# Patient Record
Sex: Female | Born: 1965 | Race: Black or African American | Hispanic: No | Marital: Single | Smoking: Never smoker
Health system: Southern US, Community
[De-identification: ages and names within clinical notes are randomized; demographics above are authoritative.]

---

## 2019-05-19 ENCOUNTER — Other Ambulatory Visit: Payer: Self-pay

## 2019-05-19 ENCOUNTER — Ambulatory Visit: Payer: 59 | Admitting: Internal Medicine

## 2019-05-19 ENCOUNTER — Encounter: Payer: Self-pay | Admitting: Internal Medicine

## 2019-05-19 VITALS — BP 144/84 | HR 80 | Temp 98.2°F | Resp 16 | Ht 62.0 in | Wt 125.1 lb

## 2019-05-19 DIAGNOSIS — T783XXA Angioneurotic edema, initial encounter: Secondary | ICD-10-CM

## 2019-05-19 DIAGNOSIS — Z1211 Encounter for screening for malignant neoplasm of colon: Secondary | ICD-10-CM

## 2019-05-19 DIAGNOSIS — E559 Vitamin D deficiency, unspecified: Secondary | ICD-10-CM | POA: Diagnosis not present

## 2019-05-19 DIAGNOSIS — I10 Essential (primary) hypertension: Secondary | ICD-10-CM

## 2019-05-19 DIAGNOSIS — Z0000 Encounter for general adult medical examination without abnormal findings: Secondary | ICD-10-CM | POA: Diagnosis not present

## 2019-05-19 DIAGNOSIS — T464X5A Adverse effect of angiotensin-converting-enzyme inhibitors, initial encounter: Secondary | ICD-10-CM

## 2019-05-19 DIAGNOSIS — Z1231 Encounter for screening mammogram for malignant neoplasm of breast: Secondary | ICD-10-CM

## 2019-05-19 DIAGNOSIS — Z124 Encounter for screening for malignant neoplasm of cervix: Secondary | ICD-10-CM | POA: Insufficient documentation

## 2019-05-19 LAB — URINALYSIS, ROUTINE W REFLEX MICROSCOPIC
Bilirubin Urine: NEGATIVE
Ketones, ur: NEGATIVE
Nitrite: NEGATIVE
Specific Gravity, Urine: 1.015 (ref 1.000–1.030)
Total Protein, Urine: NEGATIVE
Urine Glucose: NEGATIVE
Urobilinogen, UA: 0.2 (ref 0.0–1.0)
pH: 6.5 (ref 5.0–8.0)

## 2019-05-19 LAB — HEPATIC FUNCTION PANEL
ALT: 10 U/L (ref 0–35)
AST: 13 U/L (ref 0–37)
Albumin: 4.1 g/dL (ref 3.5–5.2)
Alkaline Phosphatase: 67 U/L (ref 39–117)
Bilirubin, Direct: 0 mg/dL (ref 0.0–0.3)
Total Bilirubin: 0.3 mg/dL (ref 0.2–1.2)
Total Protein: 7.5 g/dL (ref 6.0–8.3)

## 2019-05-19 LAB — LIPID PANEL
Cholesterol: 161 mg/dL (ref 0–200)
HDL: 49.5 mg/dL (ref >39.00)
LDL Cholesterol: 85 mg/dL (ref 0–99)
NonHDL: 111.71
Total CHOL/HDL Ratio: 3
Triglycerides: 136 mg/dL (ref 0.0–149.0)
VLDL: 27.2 mg/dL (ref 0.0–40.0)

## 2019-05-19 LAB — BASIC METABOLIC PANEL
BUN: 10 mg/dL (ref 6–23)
CO2: 32 mEq/L (ref 19–32)
Calcium: 9.7 mg/dL (ref 8.4–10.5)
Chloride: 100 mEq/L (ref 96–112)
Creatinine, Ser: 0.84 mg/dL (ref 0.40–1.20)
GFR: 86.1 mL/min (ref >60.00)
Glucose, Bld: 90 mg/dL (ref 70–99)
Potassium: 3.9 mEq/L (ref 3.5–5.1)
Sodium: 136 mEq/L (ref 135–145)

## 2019-05-19 LAB — VITAMIN D 25 HYDROXY (VIT D DEFICIENCY, FRACTURES): VITD: 17.86 ng/mL — ABNORMAL LOW (ref 30.00–100.00)

## 2019-05-19 LAB — TSH: TSH: 1.14 u[IU]/mL (ref 0.35–4.50)

## 2019-05-19 MED ORDER — INDAPAMIDE 1.25 MG PO TABS: 1.2500 mg | ORAL_TABLET | Freq: Every day | ORAL | 0 refills | Status: DC

## 2019-05-19 NOTE — Patient Instructions (Signed)
Health Maintenance, Female Adopting a healthy lifestyle and getting preventive care are important in promoting health and wellness. Ask your health care provider about:  The right schedule for you to have regular tests and exams.  Things you can do on your own to prevent diseases and keep yourself healthy. What should I know about diet, weight, and exercise? Eat a healthy diet   Eat a diet that includes plenty of vegetables, fruits, low-fat dairy products, and lean protein.  Do not eat a lot of foods that are high in solid fats, added sugars, or sodium. Maintain a healthy weight Body mass index (BMI) is used to identify weight problems. It estimates body fat based on height and weight. Your health care provider can help determine your BMI and help you achieve or maintain a healthy weight. Get regular exercise Get regular exercise. This is one of the most important things you can do for your health. Most adults should:  Exercise for at least 150 minutes each week. The exercise should increase your heart rate and make you sweat (moderate-intensity exercise).  Do strengthening exercises at least twice a week. This is in addition to the moderate-intensity exercise.  Spend less time sitting. Even light physical activity can be beneficial. Watch cholesterol and blood lipids Have your blood tested for lipids and cholesterol at 52 years of age, then have this test every 5 years. Have your cholesterol levels checked more often if:  Your lipid or cholesterol levels are high.  You are older than 52 years of age.  You are at high risk for heart disease. What should I know about cancer screening? Depending on your health history and family history, you may need to have cancer screening at various ages. This may include screening for:  Breast cancer.  Cervical cancer.  Colorectal cancer.  Skin cancer.  Lung cancer. What should I know about heart disease, diabetes, and high blood  pressure? Blood pressure and heart disease  High blood pressure causes heart disease and increases the risk of stroke. This is more likely to develop in people who have high blood pressure readings, are of African descent, or are overweight.  Have your blood pressure checked: ? Every 3-5 years if you are 18-39 years of age. ? Every year if you are 40 years old or older. Diabetes Have regular diabetes screenings. This checks your fasting blood sugar level. Have the screening done:  Once every three years after age 40 if you are at a normal weight and have a low risk for diabetes.  More often and at a younger age if you are overweight or have a high risk for diabetes. What should I know about preventing infection? Hepatitis B If you have a higher risk for hepatitis B, you should be screened for this virus. Talk with your health care provider to find out if you are at risk for hepatitis B infection. Hepatitis C Testing is recommended for:  Everyone born from 1945 through 1965.  Anyone with known risk factors for hepatitis C. Sexually transmitted infections (STIs)  Get screened for STIs, including gonorrhea and chlamydia, if: ? You are sexually active and are younger than 52 years of age. ? You are older than 52 years of age and your health care provider tells you that you are at risk for this type of infection. ? Your sexual activity has changed since you were last screened, and you are at increased risk for chlamydia or gonorrhea. Ask your health care provider if   you are at risk.  Ask your health care provider about whether you are at high risk for HIV. Your health care provider may recommend a prescription medicine to help prevent HIV infection. If you choose to take medicine to prevent HIV, you should first get tested for HIV. You should then be tested every 3 months for as long as you are taking the medicine. Pregnancy  If you are about to stop having your period (premenopausal) and  you may become pregnant, seek counseling before you get pregnant.  Take 400 to 800 micrograms (mcg) of folic acid every day if you become pregnant.  Ask for birth control (contraception) if you want to prevent pregnancy. Osteoporosis and menopause Osteoporosis is a disease in which the bones lose minerals and strength with aging. This can result in bone fractures. If you are 65 years old or older, or if you are at risk for osteoporosis and fractures, ask your health care provider if you should:  Be screened for bone loss.  Take a calcium or vitamin D supplement to lower your risk of fractures.  Be given hormone replacement therapy (HRT) to treat symptoms of menopause. Follow these instructions at home: Lifestyle  Do not use any products that contain nicotine or tobacco, such as cigarettes, e-cigarettes, and chewing tobacco. If you need help quitting, ask your health care provider.  Do not use street drugs.  Do not share needles.  Ask your health care provider for help if you need support or information about quitting drugs. Alcohol use  Do not drink alcohol if: ? Your health care provider tells you not to drink. ? You are pregnant, may be pregnant, or are planning to become pregnant.  If you drink alcohol: ? Limit how much you use to 0-1 drink a day. ? Limit intake if you are breastfeeding.  Be aware of how much alcohol is in your drink. In the [place] one drink equals one 12 oz bottle of beer (355 mL), one 5 oz glass of wine (148 mL), or one 1 oz glass of hard liquor (44 mL). General instructions  Schedule regular health, dental, and eye exams.  Stay current with your vaccines.  Tell your health care provider if: ? You often feel depressed. ? You have ever been abused or do not feel safe at home. Summary  Adopting a healthy lifestyle and getting preventive care are important in promoting health and wellness.  Follow your health care provider's instructions about healthy  diet, exercising, and getting tested or screened for diseases.  Follow your health care provider's instructions on monitoring your cholesterol and blood pressure. This information is not intended to replace advice given to you by your health care provider. Make sure you discuss any questions you have with your health care provider. Document Revised: 02/11/2018 Document Reviewed: 02/11/2018 Elsevier Patient Education  2020 Elsevier Inc.  

## 2019-05-19 NOTE — Progress Notes (Signed)
Subjective:  Patient ID: Tracy Walton, female    DOB: 1965-05-01  Age: 54 y.o. MRN: [id number]  CC: Annual Exam and Hypertension  This visit occurred during the SARS-CoV-2 public health emergency.  Safety protocols were in place, including screening questions prior to the visit, additional usage of staff PPE, and extensive cleaning of exam room while observing appropriate contact time as indicated for disinfecting solutions.   NEW TO ME  HPI Lillias Difrancesco presents for a CPX.  She recently moved to [place] from [place].  She was being treated at [facility] for hypertension.  She has been prescribed lisinopril and hydrochlorothiazide.  Since taking that she has had intermittent welts pop up in her skin.  She has one today on her right middle finger.  She notices them but they do not bother her very much.  She denies cough, wheezing, itching, or edema.  She is very active and denies any recent episodes of CP, DOE, palpitations, edema, or fatigue.  She does not monitor her blood pressure.   History Wren has no past medical history on file.   She has no past surgical history on file.   Her family history includes Hypertension in her father and mother.She reports that she has never smoked. She has never used smokeless tobacco. She reports previous alcohol use. She reports that she does not use drugs.  Outpatient Medications Prior to Visit  Medication Sig Dispense Refill  . lisinopril-hydrochlorothiazide (ZESTORETIC) 10-12.5 MG tablet Take 1 tablet by mouth daily.     No facility-administered medications prior to visit.    ROS Review of Systems  Constitutional: Negative for diaphoresis, fatigue and unexpected weight change.  HENT: Negative.   Eyes: Negative for visual disturbance.  Respiratory: Negative for cough, chest tightness, shortness of breath and wheezing.   Cardiovascular: Negative for chest pain, palpitations and leg swelling.  Gastrointestinal: Negative  for abdominal pain, constipation, diarrhea, nausea and vomiting.  Endocrine: Negative.   Genitourinary: Negative.  Negative for difficulty urinating, dysuria and hematuria.  Musculoskeletal: Negative.  Negative for arthralgias, back pain, myalgias and neck pain.  Skin: Positive for rash. Negative for color change.  Neurological: Negative.  Negative for dizziness, weakness and light-headedness.  Hematological: Negative for adenopathy. Does not bruise/bleed easily.  Psychiatric/Behavioral: Negative.     Objective:  BP (!) 144/84 (BP Location: Left Arm, Patient Position: Sitting, Cuff Size: Normal)   Pulse 80   Temp 98.2 F (36.8 C) (Oral)   Resp 16   Ht 5\' 2"  (1.575 m)   Wt 125 lb 2 oz (56.8 kg)   SpO2 99%   BMI 22.89 kg/m   Physical Exam Vitals reviewed.  Constitutional:      Appearance: Normal appearance.  HENT:     Nose: Nose normal.     Mouth/Throat:     Mouth: Mucous membranes are moist.  Eyes:     General: No scleral icterus.    Conjunctiva/sclera: Conjunctivae normal.  Cardiovascular:     Rate and Rhythm: Normal rate and regular rhythm.     Heart sounds: No murmur.     Comments: EKG -  NSR No LVH Normal EKG Pulmonary:     Effort: Pulmonary effort is normal.     Breath sounds: No stridor. No wheezing, rhonchi or rales.  Abdominal:     General: Abdomen is flat. Bowel sounds are normal. There is no distension.     Palpations: Abdomen is soft. There is no hepatomegaly, splenomegaly or mass.  Tenderness: There is no abdominal tenderness.  Musculoskeletal:     Cervical back: Neck supple.     Right lower leg: No edema.     Left lower leg: No edema.     Comments: Middle phalanx, radial side, right middle finger there is a discrete area of soft tissue swelling that measures 2 x 3 cm.  Lymphadenopathy:     Cervical: No cervical adenopathy.  Skin:    General: Skin is warm and dry.     Findings: Rash present.  Neurological:     General: No focal deficit present.      Mental Status: She is alert.  Psychiatric:        Mood and Affect: Mood normal.        Behavior: Behavior normal.     Lab Results  Component Value Date   GLUCOSE 90 05/19/2019   CHOL 161 05/19/2019   TRIG 136.0 05/19/2019   HDL 49.50 05/19/2019   LDLCALC 85 05/19/2019   ALT 10 05/19/2019   AST 13 05/19/2019   NA 136 05/19/2019   K 3.9 05/19/2019   CL 100 05/19/2019   CREATININE 0.84 05/19/2019   BUN 10 05/19/2019   CO2 32 05/19/2019   TSH 1.14 05/19/2019    Assessment & Plan:   Klara was seen today for annual exam and hypertension.  Diagnoses and all orders for this visit:  Routine general medical examination at a health care facility- Exam completed, labs reviewed, I have requested records from her prior provider about vaccine history, she tells me she is due for screening for cervical, breast, and colon cancer. -     Lipid panel -     HIV Antibody (routine testing w rflx)  Essential hypertension- Her blood pressure is not adequately well controlled and she has angioedema.  I think this is due to the ACE inhibitor.  I have asked her to stop taking lisinopril HCTZ.  Will control her blood pressure with indapamide.  Her labs are negative for secondary causes or endorgan damage.  Her EKG is negative for LVH. -     Basic metabolic panel -     TSH -     Urinalysis, Routine w reflex microscopic -     VITAMIN D 25 Hydroxy (Vit-D Deficiency, Fractures) -     Hepatic function panel -     indapamide (LOZOL) 1.25 MG tablet; Take 1 tablet (1.25 mg total) by mouth daily. -     EKG 12-Lead  Visit for screening mammogram -     MM DIGITAL SCREENING BILATERAL; Future  Screen for colon cancer  Screening for cervical cancer -     Ambulatory referral to Gynecology  Colon cancer screening -     Cologuard  Vitamin D deficiency disease -     Cholecalciferol 50 MCG (2000 UT) TABS; Take 1 tablet (2,000 Units total) by mouth daily.  Angioedema due to angiotensin converting  enzyme inhibitor (ACE-I)   I have discontinued Talbert [place] Vita's lisinopril-hydrochlorothiazide. I am also having her start on indapamide and Cholecalciferol.  Meds ordered this encounter  Medications  . indapamide (LOZOL) 1.25 MG tablet    Sig: Take 1 tablet (1.25 mg total) by mouth daily.    Dispense:  90 tablet    Refill:  0  . Cholecalciferol 50 MCG (2000 UT) TABS    Sig: Take 1 tablet (2,000 Units total) by mouth daily.    Dispense:  90 tablet    Refill:  1  Follow-up: Return in about 3 months (around 08/19/2019).  Scarlette Calico, MD

## 2019-05-20 ENCOUNTER — Encounter: Payer: Self-pay | Admitting: Internal Medicine

## 2019-05-20 DIAGNOSIS — E559 Vitamin D deficiency, unspecified: Secondary | ICD-10-CM | POA: Insufficient documentation

## 2019-05-20 DIAGNOSIS — T464X5A Adverse effect of angiotensin-converting-enzyme inhibitors, initial encounter: Secondary | ICD-10-CM | POA: Insufficient documentation

## 2019-05-20 DIAGNOSIS — T783XXA Angioneurotic edema, initial encounter: Secondary | ICD-10-CM | POA: Insufficient documentation

## 2019-05-20 DIAGNOSIS — Z1211 Encounter for screening for malignant neoplasm of colon: Secondary | ICD-10-CM | POA: Insufficient documentation

## 2019-05-20 LAB — HIV ANTIBODY (ROUTINE TESTING W REFLEX): HIV 1&2 Ab, 4th Generation: NONREACTIVE

## 2019-05-20 MED ORDER — CHOLECALCIFEROL 50 MCG (2000 UT) PO TABS: 1.0000 | ORAL_TABLET | Freq: Every day | ORAL | 1 refills | Status: DC

## 2019-06-10 ENCOUNTER — Ambulatory Visit: Payer: 59 | Attending: Family

## 2019-06-10 DIAGNOSIS — Z23 Encounter for immunization: Secondary | ICD-10-CM

## 2019-06-10 NOTE — Progress Notes (Signed)
   Covid-19 Vaccination Clinic  Name:  Quierra Silverio    MRN: [id number] DOB: 05/29/1965  06/10/2019  Ms. Credeur was observed post Covid-19 immunization for 15 minutes without incident. She was provided with Vaccine Information Sheet and instruction to access the V-Safe system.   Ms. Papandrea was instructed to call 911 with any severe reactions post vaccine: Marland Kitchen Difficulty breathing  . Swelling of face and throat  . A fast heartbeat  . A bad rash all over body  . Dizziness and weakness   Immunizations Administered    Name Date Dose VIS Date Route   Moderna COVID-19 Vaccine 06/10/2019  3:33 PM 0.5 mL 02/02/2019 Intramuscular   Manufacturer: Moderna   Lot: [id number]   NDC: 39688-648-47

## 2019-06-11 ENCOUNTER — Encounter: Payer: Self-pay | Admitting: Internal Medicine

## 2019-07-13 ENCOUNTER — Ambulatory Visit: Payer: 59 | Attending: Family

## 2019-07-13 ENCOUNTER — Ambulatory Visit: Payer: 59

## 2019-07-13 DIAGNOSIS — Z23 Encounter for immunization: Secondary | ICD-10-CM

## 2019-07-13 NOTE — Progress Notes (Signed)
   Covid-19 Vaccination Clinic  Name:  Tracy Walton    MRN: [id number] DOB: [birth year]  07/13/2019  Ms. Biscoe was observed post Covid-19 immunization for 15 minutes without incident. She was provided with Vaccine Information Sheet and instruction to access the V-Safe system.   Ms. Haft was instructed to call 911 with any severe reactions post vaccine: Marland Kitchen Difficulty breathing  . Swelling of face and throat  . A fast heartbeat  . A bad rash all over body  . Dizziness and weakness   Immunizations Administered    Name Date Dose VIS Date Route   Moderna COVID-19 Vaccine 07/13/2019 12:22 PM 0.5 mL 02/2019 Intramuscular   Manufacturer: Moderna   Lot: [id number]   NDC: 53664-403-47

## 2019-08-12 LAB — HM DIABETES EYE EXAM

## 2019-08-19 ENCOUNTER — Ambulatory Visit: Payer: 59 | Admitting: Internal Medicine

## 2019-08-19 ENCOUNTER — Encounter: Payer: Self-pay | Admitting: Internal Medicine

## 2019-08-19 ENCOUNTER — Other Ambulatory Visit: Payer: Self-pay

## 2019-08-19 VITALS — BP 140/90 | HR 81 | Temp 98.5°F | Ht 62.0 in | Wt 127.6 lb

## 2019-08-19 DIAGNOSIS — I10 Essential (primary) hypertension: Secondary | ICD-10-CM

## 2019-08-19 DIAGNOSIS — Z1231 Encounter for screening mammogram for malignant neoplasm of breast: Secondary | ICD-10-CM

## 2019-08-19 DIAGNOSIS — E559 Vitamin D deficiency, unspecified: Secondary | ICD-10-CM | POA: Diagnosis not present

## 2019-08-19 DIAGNOSIS — M67441 Ganglion, right hand: Secondary | ICD-10-CM

## 2019-08-19 LAB — BASIC METABOLIC PANEL
BUN: 15 mg/dL (ref 6–23)
CO2: 34 mEq/L — ABNORMAL HIGH (ref 19–32)
Calcium: 9.7 mg/dL (ref 8.4–10.5)
Chloride: 97 mEq/L (ref 96–112)
Creatinine, Ser: 0.86 mg/dL (ref 0.40–1.20)
GFR: 83.71 mL/min (ref >60.00)
Glucose, Bld: 95 mg/dL (ref 70–99)
Potassium: 3.8 mEq/L (ref 3.5–5.1)
Sodium: 138 mEq/L (ref 135–145)

## 2019-08-19 LAB — VITAMIN D 25 HYDROXY (VIT D DEFICIENCY, FRACTURES): VITD: 22.01 ng/mL — ABNORMAL LOW (ref 30.00–100.00)

## 2019-08-19 MED ORDER — NEBIVOLOL HCL 5 MG PO TABS: 5.0000 mg | ORAL_TABLET | Freq: Every day | ORAL | 0 refills | Status: DC

## 2019-08-19 MED ORDER — CHOLECALCIFEROL 50 MCG (2000 UT) PO TABS: 2.0000 | ORAL_TABLET | Freq: Every day | ORAL | 1 refills | Status: DC

## 2019-08-19 NOTE — Patient Instructions (Signed)

## 2019-08-19 NOTE — Progress Notes (Signed)
Subjective:  Patient ID: Tracy Walton, female    DOB: 1965-11-07  Age: 54 y.o. MRN: [id number]  CC: Follow-up (3 month follow up)  This visit occurred during the SARS-CoV-2 public health emergency.  Safety protocols were in place, including screening questions prior to the visit, additional usage of staff PPE, and extensive cleaning of exam room while observing appropriate contact time as indicated for disinfecting solutions.    HPI Taiylor Virden presents for f/up - She tells me her blood pressure is usually around 140/90 at home.  She is very active and denies any recent episodes of chest pain, shortness of breath, palpitations, edema, fatigue, dizziness, lightheadedness, or headache.  She tells me she is taking the indapamide and the vitamin D supplement.  Outpatient Medications Prior to Visit  Medication Sig Dispense Refill  . indapamide (LOZOL) 1.25 MG tablet Take 1 tablet (1.25 mg total) by mouth daily. 90 tablet 0  . Cholecalciferol 50 MCG (2000 UT) TABS Take 1 tablet (2,000 Units total) by mouth daily. 90 tablet 1   No facility-administered medications prior to visit.    ROS Review of Systems  Constitutional: Negative for appetite change, diaphoresis, fatigue and unexpected weight change.  HENT: Negative.   Eyes: Negative for visual disturbance.  Respiratory: Negative for cough, chest tightness, shortness of breath and wheezing.   Cardiovascular: Negative for chest pain, palpitations and leg swelling.  Gastrointestinal: Negative for abdominal pain, constipation, diarrhea, nausea and vomiting.  Genitourinary: Negative.  Negative for decreased urine volume, difficulty urinating, dysuria, flank pain, hematuria and urgency.  Musculoskeletal: Negative for arthralgias, joint swelling and myalgias.  Skin: Negative.  Negative for color change and pallor.  Neurological: Negative.  Negative for dizziness, weakness, light-headedness and headaches.  Hematological: Negative for  adenopathy. Does not bruise/bleed easily.  Psychiatric/Behavioral: Negative.     Objective:  BP 140/90 (BP Location: Left Arm, Patient Position: Sitting, Cuff Size: Normal)   Pulse 81   Temp 98.5 F (36.9 C) (Oral)   Ht 5\' 2"  (1.575 m)   Wt 127 lb 9.6 oz (57.9 kg)   SpO2 98%   BMI 23.34 kg/m   BP Readings from Last 3 Encounters:  08/19/19 140/90  05/19/19 (!) 144/84    Wt Readings from Last 3 Encounters:  08/19/19 127 lb 9.6 oz (57.9 kg)  05/19/19 125 lb 2 oz (56.8 kg)    Physical Exam Vitals reviewed.  Constitutional:      Appearance: Normal appearance.  HENT:     Nose: Nose normal.     Mouth/Throat:     Mouth: Mucous membranes are moist.  Eyes:     General: No scleral icterus.    Conjunctiva/sclera: Conjunctivae normal.  Cardiovascular:     Rate and Rhythm: Normal rate and regular rhythm.     Heart sounds: No murmur heard.   Pulmonary:     Effort: Pulmonary effort is normal.     Breath sounds: No stridor. No wheezing, rhonchi or rales.  Abdominal:     General: Abdomen is flat. Bowel sounds are normal. There is no distension.     Palpations: Abdomen is soft. There is no hepatomegaly, splenomegaly or mass.     Tenderness: There is no abdominal tenderness.  Musculoskeletal:        General: Normal range of motion.     Cervical back: Neck supple.     Right lower leg: No edema.     Left lower leg: No edema.     Comments: RMF - middle  phalanx, radial side, there is a 1.5 cm well-circumscribed subcutaneous cystic lesion that is firm but freely mobile and nontender.  The overlying skin is normal.  There is no induration or fluctuance.  Lymphadenopathy:     Cervical: No cervical adenopathy.  Skin:    General: Skin is warm and dry.  Neurological:     General: No focal deficit present.     Mental Status: She is alert.     Lab Results  Component Value Date   GLUCOSE 95 08/19/2019   CHOL 161 05/19/2019   TRIG 136.0 05/19/2019   HDL 49.50 05/19/2019   LDLCALC  85 05/19/2019   ALT 10 05/19/2019   AST 13 05/19/2019   NA 138 08/19/2019   K 3.8 08/19/2019   CL 97 08/19/2019   CREATININE 0.86 08/19/2019   BUN 15 08/19/2019   CO2 34 (H) 08/19/2019   TSH 1.14 05/19/2019    Patient was never admitted.  Assessment & Plan:   Jazzman was seen today for follow-up.  Diagnoses and all orders for this visit:  Visit for screening mammogram -     MM DIGITAL SCREENING BILATERAL; Future  Essential hypertension- Her blood pressure is not adequately well controlled.  Will add nebivolol to the thiazide diuretic. -     Basic metabolic panel; Future -     nebivolol (BYSTOLIC) 5 MG tablet; Take 1 tablet (5 mg total) by mouth daily. -     Basic metabolic panel  Vitamin D deficiency disease- Her vitamin D level remains low.  I have asked her to double the dose of her vitamin D supplement. -     VITAMIN D 25 Hydroxy (Vit-D Deficiency, Fractures); Future -     VITAMIN D 25 Hydroxy (Vit-D Deficiency, Fractures) -     Cholecalciferol 50 MCG (2000 UT) TABS; Take 2 tablets (4,000 Units total) by mouth daily.  Ganglion cyst of finger of right hand- I have asked her to see hand surgery to consider having this evaluated. -     Ambulatory referral to Orthopedic Surgery   I have changed Akyra Markham's Cholecalciferol. I am also having her start on nebivolol. Additionally, I am having her maintain her indapamide.  Meds ordered this encounter  Medications  . nebivolol (BYSTOLIC) 5 MG tablet    Sig: Take 1 tablet (5 mg total) by mouth daily.    Dispense:  115 tablet    Refill:  0  . Cholecalciferol 50 MCG (2000 UT) TABS    Sig: Take 2 tablets (4,000 Units total) by mouth daily.    Dispense:  180 tablet    Refill:  1     Follow-up: Return in about 3 months (around 11/19/2019).  Sanda Linger, MD

## 2019-08-20 ENCOUNTER — Encounter: Payer: Self-pay | Admitting: Internal Medicine

## 2019-08-21 ENCOUNTER — Other Ambulatory Visit: Payer: Self-pay | Admitting: Internal Medicine

## 2019-08-21 DIAGNOSIS — I10 Essential (primary) hypertension: Secondary | ICD-10-CM

## 2019-08-30 ENCOUNTER — Encounter: Payer: Self-pay | Admitting: Internal Medicine

## 2019-08-31 ENCOUNTER — Ambulatory Visit: Payer: 59 | Admitting: Orthopaedic Surgery

## 2019-08-31 ENCOUNTER — Encounter: Payer: Self-pay | Admitting: Orthopaedic Surgery

## 2019-08-31 ENCOUNTER — Ambulatory Visit: Payer: Self-pay

## 2019-08-31 VITALS — Ht 62.0 in | Wt 127.6 lb

## 2019-08-31 DIAGNOSIS — M79641 Pain in right hand: Secondary | ICD-10-CM

## 2019-08-31 NOTE — Progress Notes (Signed)
Office Visit Note   Patient: Tracy Walton           Date of Birth: 1965/08/22           MRN: [id number] Visit Date: 08/31/2019              Requested by: Etta Grandchild, MD [address] [place],  [place] [address] PCP: Etta Grandchild, MD   Assessment & Plan: Visit Diagnoses:  1. Pain of right hand     Plan: Impression is a right long finger mass.  We will need to obtain an MRI with contrast to fully evaluate.  Follow-up after the MRI.  Follow-Up Instructions: Return if symptoms worsen or fail to improve.   Orders:  Orders Placed This Encounter  Procedures  . XR Hand Complete Right  . MR FINGERS RIGHT W CONTRAST   No orders of the defined types were placed in this encounter.     Procedures: No procedures performed   Clinical Data: No additional findings.   Subjective: Chief Complaint  Patient presents with  . Right Hand - Pain, New Patient (Initial Visit)    Middle finger lump/cyst    Tracy Walton is a 54 year old female comes in for evaluation of a mass on her right long finger that she noticed about 4 months ago.  Denies any pain.  She has not noticed any changes in size.  Denies any constitutional symptoms.  Denies a history of cancer.   Review of Systems  Constitutional: Negative.   HENT: Negative.   Eyes: Negative.   Respiratory: Negative.   Cardiovascular: Negative.   Endocrine: Negative.   Musculoskeletal: Negative.   Neurological: Negative.   Hematological: Negative.   Psychiatric/Behavioral: Negative.   All other systems reviewed and are negative.    Objective: Vital Signs: Ht 5\' 2"  (1.575 m)   Wt 127 lb 9.6 oz (57.9 kg)   BMI 23.34 kg/m   Physical Exam Vitals and nursing note reviewed.  Constitutional:      Appearance: She is well-developed.  HENT:     Head: Normocephalic and atraumatic.  Pulmonary:     Effort: Pulmonary effort is normal.  Abdominal:     Palpations: Abdomen is soft.  Musculoskeletal:     Cervical back: Neck  supple.  Skin:    General: Skin is warm.     Capillary Refill: Capillary refill takes less than 2 seconds.  Neurological:     Mental Status: She is alert and oriented to person, place, and time.  Psychiatric:        Behavior: Behavior normal.        Thought Content: Thought content normal.        Judgment: Judgment normal.     Ortho Exam Right long finger shows a palpable not mobile relatively firm mass on the ulnar aspect of the middle phalanx.  No neurovascular compromise.  Full range of motion function of the hand. Specialty Comments:  No specialty comments available.  Imaging: XR Hand Complete Right  Result Date: 08/31/2019 Soft tissue shadow of the long finger.  No bony abnormalities.    PMFS History: Patient Active Problem List   Diagnosis Date Noted  . Ganglion cyst of finger of right hand 08/19/2019  . Colon cancer screening 05/20/2019  . Vitamin D deficiency disease 05/20/2019  . Angioedema due to angiotensin converting enzyme inhibitor (ACE-I) 05/20/2019  . Routine general medical examination at a health care facility 05/19/2019  . Essential hypertension 05/19/2019  . Visit for  screening mammogram 05/19/2019  . Screen for colon cancer 05/19/2019  . Screening for cervical cancer 05/19/2019   No past medical history on file.  Family History  Problem Relation Age of Onset  . Hypertension Mother   . Hypertension Father     No past surgical history on file. Social History   Occupational History  . Not on file  Tobacco Use  . Smoking status: Never Smoker  . Smokeless tobacco: Never Used  Substance and Sexual Activity  . Alcohol use: Not Currently  . Drug use: Never  . Sexual activity: Not Currently    Partners: Female

## 2019-10-04 ENCOUNTER — Other Ambulatory Visit: Payer: Self-pay

## 2019-10-04 DIAGNOSIS — M79641 Pain in right hand: Secondary | ICD-10-CM

## 2019-11-15 ENCOUNTER — Other Ambulatory Visit: Payer: Self-pay | Admitting: Internal Medicine

## 2019-11-15 DIAGNOSIS — I10 Essential (primary) hypertension: Secondary | ICD-10-CM

## 2019-11-18 ENCOUNTER — Other Ambulatory Visit: Payer: Self-pay

## 2019-11-18 ENCOUNTER — Ambulatory Visit
Admission: RE | Admit: 2019-11-18 | Discharge: 2019-11-18 | Disposition: A | Discharge status: Home / Self Care | Payer: 59 | Source: Ambulatory Visit | Attending: Internal Medicine | Admitting: Internal Medicine

## 2019-11-18 DIAGNOSIS — Z1231 Encounter for screening mammogram for malignant neoplasm of breast: Secondary | ICD-10-CM

## 2019-12-08 ENCOUNTER — Encounter: Payer: Self-pay | Admitting: Internal Medicine

## 2019-12-08 ENCOUNTER — Other Ambulatory Visit: Payer: Self-pay

## 2019-12-08 ENCOUNTER — Ambulatory Visit: Payer: 59 | Admitting: Internal Medicine

## 2019-12-08 VITALS — BP 142/78 | HR 96 | Temp 98.4°F | Resp 16 | Ht 62.0 in | Wt 129.0 lb

## 2019-12-08 DIAGNOSIS — I10 Essential (primary) hypertension: Secondary | ICD-10-CM

## 2019-12-08 DIAGNOSIS — E876 Hypokalemia: Secondary | ICD-10-CM

## 2019-12-08 DIAGNOSIS — T502X5A Adverse effect of carbonic-anhydrase inhibitors, benzothiadiazides and other diuretics, initial encounter: Secondary | ICD-10-CM | POA: Diagnosis not present

## 2019-12-08 DIAGNOSIS — Z1211 Encounter for screening for malignant neoplasm of colon: Secondary | ICD-10-CM

## 2019-12-08 DIAGNOSIS — E559 Vitamin D deficiency, unspecified: Secondary | ICD-10-CM

## 2019-12-08 LAB — BASIC METABOLIC PANEL
BUN: 15 mg/dL (ref 6–23)
CO2: 35 mEq/L — ABNORMAL HIGH (ref 19–32)
Calcium: 10.2 mg/dL (ref 8.4–10.5)
Chloride: 97 mEq/L (ref 96–112)
Creatinine, Ser: 0.94 mg/dL (ref 0.40–1.20)
GFR: 69.41 mL/min (ref >60.00)
Glucose, Bld: 94 mg/dL (ref 70–99)
Potassium: 3.4 mEq/L — ABNORMAL LOW (ref 3.5–5.1)
Sodium: 136 mEq/L (ref 135–145)

## 2019-12-08 LAB — HM MAMMOGRAPHY

## 2019-12-08 LAB — VITAMIN D 25 HYDROXY (VIT D DEFICIENCY, FRACTURES): VITD: 26 ng/mL — ABNORMAL LOW (ref 30.00–100.00)

## 2019-12-08 MED ORDER — POTASSIUM CHLORIDE ER 10 MEQ PO TBCR: 10.0000 meq | EXTENDED_RELEASE_TABLET | Freq: Two times a day (BID) | ORAL | 0 refills | Status: DC

## 2019-12-08 MED ORDER — NEBIVOLOL HCL 5 MG PO TABS: 5.0000 mg | ORAL_TABLET | Freq: Every day | ORAL | 1 refills | Status: DC

## 2019-12-08 NOTE — Patient Instructions (Signed)

## 2019-12-08 NOTE — Progress Notes (Signed)
Subjective:  Patient ID: Tracy Walton, female    DOB: [birth year]  Age: 54 y.o. MRN: [id number]  CC: Hypertension  This visit occurred during the SARS-CoV-2 public health emergency.  Safety protocols were in place, including screening questions prior to the visit, additional usage of staff PPE, and extensive cleaning of exam room while observing appropriate contact time as indicated for disinfecting solutions.    HPI Jezabelle Chisolm presents for f/up - She does not monitor her blood pressure.  She is active and denies any recent episodes of chest pain, shortness of breath, palpitations, edema, or fatigue.  She tells me she is taking indapamide but she is not taking nebivolol.  She tells me she read some negative comments about nebivolol so she did not start taking it.  Outpatient Medications Prior to Visit  Medication Sig Dispense Refill  . Cholecalciferol 50 MCG (2000 UT) TABS Take 2 tablets (4,000 Units total) by mouth daily. 180 tablet 1  . indapamide (LOZOL) 1.25 MG tablet TAKE 1 TABLET(1.25 MG) BY MOUTH DAILY 90 tablet 0  . nebivolol (BYSTOLIC) 5 MG tablet Take 1 tablet (5 mg total) by mouth daily. 115 tablet 0   No facility-administered medications prior to visit.    ROS Review of Systems  Constitutional: Negative.  Negative for diaphoresis and fatigue.  HENT: Negative.   Eyes: Negative for visual disturbance.  Respiratory: Negative for chest tightness and shortness of breath.   Cardiovascular: Negative for chest pain, palpitations and leg swelling.  Gastrointestinal: Negative for abdominal pain, constipation, diarrhea, nausea and vomiting.  Endocrine: Negative.   Genitourinary: Negative.  Negative for difficulty urinating, dysuria and hematuria.  Musculoskeletal: Negative.  Negative for myalgias.  Skin: Negative.  Negative for color change.  Neurological: Negative for dizziness, weakness, light-headedness and headaches.  Hematological: Negative for adenopathy. Does not  bruise/bleed easily.  Psychiatric/Behavioral: Negative.     Objective:  BP (!) 142/78   Pulse 96   Temp 98.4 F (36.9 C) (Oral)   Resp 16   Ht 5\' 2"  (1.575 m)   Wt 129 lb (58.5 kg)   SpO2 99%   BMI 23.59 kg/m   BP Readings from Last 3 Encounters:  12/08/19 (!) 142/78  08/19/19 140/90  05/19/19 (!) 144/84    Wt Readings from Last 3 Encounters:  12/08/19 129 lb (58.5 kg)  08/31/19 127 lb 9.6 oz (57.9 kg)  08/19/19 127 lb 9.6 oz (57.9 kg)    Physical Exam Vitals reviewed.  Constitutional:      Appearance: Normal appearance.  HENT:     Mouth/Throat:     Mouth: Mucous membranes are moist.  Eyes:     General: No scleral icterus.    Conjunctiva/sclera: Conjunctivae normal.  Cardiovascular:     Rate and Rhythm: Normal rate and regular rhythm.  Pulmonary:     Breath sounds: No stridor. No wheezing, rhonchi or rales.  Abdominal:     General: Abdomen is flat. Bowel sounds are normal. There is no distension.     Palpations: Abdomen is soft. There is no hepatomegaly, splenomegaly or mass.  Musculoskeletal:        General: Normal range of motion.     Cervical back: Neck supple.     Right lower leg: No edema.     Left lower leg: No edema.  Lymphadenopathy:     Cervical: No cervical adenopathy.  Skin:    General: Skin is warm and dry.  Neurological:     Mental Status: She is alert.  Psychiatric:        Mood and Affect: Mood normal.        Behavior: Behavior normal.     Lab Results  Component Value Date   GLUCOSE 94 12/08/2019   CHOL 161 05/19/2019   TRIG 136.0 05/19/2019   HDL 49.50 05/19/2019   LDLCALC 85 05/19/2019   ALT 10 05/19/2019   AST 13 05/19/2019   NA 136 12/08/2019   K 3.4 (L) 12/08/2019   CL 97 12/08/2019   CREATININE 0.94 12/08/2019   BUN 15 12/08/2019   CO2 35 (H) 12/08/2019   TSH 1.14 05/19/2019    MM DIGITAL SCREENING BILATERAL  Result Date: 12/08/2019 CLINICAL DATA:  Screening. EXAM: DIGITAL SCREENING BILATERAL MAMMOGRAM WITH CAD  COMPARISON:  Previous exam(s). ACR Breast Density Category c: The breast tissue is heterogeneously dense, which may obscure small masses. FINDINGS: There are no findings suspicious for malignancy. Images were processed with CAD. IMPRESSION: No mammographic evidence of malignancy. A result letter of this screening mammogram will be mailed directly to the patient. RECOMMENDATION: Screening mammogram in one year. (Code:SM-B-01Y) BI-RADS CATEGORY  1: Negative. Electronically Signed   By: Amie Portland M.D.   On: 12/08/2019 09:31    Assessment & Plan:   Marena was seen today for hypertension.  Diagnoses and all orders for this visit:  Essential hypertension- She has not achieved her systolic blood pressure goal of 130.  I recommended that she take the nebivolol as directed.  She has developed mild hypokalemia from the thiazide diuretics I recommended that she start taking a potassium supplement.  She agrees to stay on the current dose of indapamide. -     BASIC METABOLIC PANEL WITH GFR; Future -     Basic metabolic panel; Future -     Cancel: BASIC METABOLIC PANEL WITH GFR -     Basic metabolic panel -     potassium chloride (KLOR-CON) 10 MEQ tablet; Take 1 tablet (10 mEq total) by mouth 2 (two) times daily. -     nebivolol (BYSTOLIC) 5 MG tablet; Take 1 tablet (5 mg total) by mouth daily.  Screen for colon cancer -     Cologuard  Vitamin D deficiency disease- Her vitamin D level has improved some. -     VITAMIN D 25 Hydroxy (Vit-D Deficiency, Fractures); Future -     VITAMIN D 25 Hydroxy (Vit-D Deficiency, Fractures)  Diuretic-induced hypokalemia -     potassium chloride (KLOR-CON) 10 MEQ tablet; Take 1 tablet (10 mEq total) by mouth 2 (two) times daily.   I am having Tracy Walton start on potassium chloride. I am also having her maintain her Cholecalciferol, indapamide, and nebivolol.  Meds ordered this encounter  Medications  . potassium chloride (KLOR-CON) 10 MEQ tablet    Sig: Take  1 tablet (10 mEq total) by mouth 2 (two) times daily.    Dispense:  180 tablet    Refill:  0  . nebivolol (BYSTOLIC) 5 MG tablet    Sig: Take 1 tablet (5 mg total) by mouth daily.    Dispense:  90 tablet    Refill:  1     Follow-up: Return in about 6 months (around 06/07/2020).  Sanda Linger, MD

## 2020-01-07 ENCOUNTER — Telehealth: Payer: Self-pay

## 2020-01-07 NOTE — Telephone Encounter (Signed)
LVM reminder pt to send cologuard kit & to call office if any questions.

## 2020-02-18 ENCOUNTER — Other Ambulatory Visit: Payer: Self-pay | Admitting: Internal Medicine

## 2020-02-18 DIAGNOSIS — I10 Essential (primary) hypertension: Secondary | ICD-10-CM

## 2020-02-21 ENCOUNTER — Ambulatory Visit: Payer: 59 | Attending: Family

## 2020-02-21 DIAGNOSIS — Z23 Encounter for immunization: Secondary | ICD-10-CM

## 2020-04-13 NOTE — Telephone Encounter (Addendum)
Pt states she does have the kit & will try to complete it.  Educated on the importance of colon cancer screening.  Pt verb understanding. 

## 2020-05-25 ENCOUNTER — Other Ambulatory Visit: Payer: Self-pay | Admitting: Internal Medicine

## 2020-05-25 DIAGNOSIS — I10 Essential (primary) hypertension: Secondary | ICD-10-CM

## 2020-06-07 ENCOUNTER — Encounter: Payer: Self-pay | Admitting: Internal Medicine

## 2020-06-07 ENCOUNTER — Other Ambulatory Visit: Payer: Self-pay

## 2020-06-07 ENCOUNTER — Ambulatory Visit: Payer: 59 | Admitting: Internal Medicine

## 2020-06-07 VITALS — BP 132/78 | HR 80 | Temp 98.6°F | Resp 16 | Ht 62.0 in | Wt 136.0 lb

## 2020-06-07 DIAGNOSIS — I10 Essential (primary) hypertension: Secondary | ICD-10-CM

## 2020-06-07 DIAGNOSIS — E876 Hypokalemia: Secondary | ICD-10-CM

## 2020-06-07 DIAGNOSIS — Z0000 Encounter for general adult medical examination without abnormal findings: Secondary | ICD-10-CM | POA: Diagnosis not present

## 2020-06-07 DIAGNOSIS — T502X5A Adverse effect of carbonic-anhydrase inhibitors, benzothiadiazides and other diuretics, initial encounter: Secondary | ICD-10-CM | POA: Diagnosis not present

## 2020-06-07 DIAGNOSIS — E559 Vitamin D deficiency, unspecified: Secondary | ICD-10-CM | POA: Diagnosis not present

## 2020-06-07 DIAGNOSIS — Z1211 Encounter for screening for malignant neoplasm of colon: Secondary | ICD-10-CM

## 2020-06-07 DIAGNOSIS — Z124 Encounter for screening for malignant neoplasm of cervix: Secondary | ICD-10-CM

## 2020-06-07 LAB — CBC WITH DIFFERENTIAL/PLATELET
Basophils Absolute: 0 10*3/uL (ref 0.0–0.1)
Basophils Relative: 0.4 % (ref 0.0–3.0)
Eosinophils Absolute: 0.1 10*3/uL (ref 0.0–0.7)
Eosinophils Relative: 2.3 % (ref 0.0–5.0)
HCT: 38.8 % (ref 36.0–46.0)
Hemoglobin: 12.9 g/dL (ref 12.0–15.0)
Lymphocytes Relative: 25 % (ref 12.0–46.0)
Lymphs Abs: 1.5 10*3/uL (ref 0.7–4.0)
MCHC: 33.2 g/dL (ref 30.0–36.0)
MCV: 86.8 fl (ref 78.0–100.0)
Monocytes Absolute: 0.5 10*3/uL (ref 0.1–1.0)
Monocytes Relative: 8.6 % (ref 3.0–12.0)
Neutro Abs: 3.8 10*3/uL (ref 1.4–7.7)
Neutrophils Relative %: 63.7 % (ref 43.0–77.0)
Platelets: 203 10*3/uL (ref 150.0–400.0)
RBC: 4.47 Mil/uL (ref 3.87–5.11)
RDW: 14.7 % (ref 11.5–15.5)
WBC: 6 10*3/uL (ref 4.0–10.5)

## 2020-06-07 LAB — BASIC METABOLIC PANEL
BUN: 10 mg/dL (ref 6–23)
CO2: 32 mEq/L (ref 19–32)
Calcium: 9.6 mg/dL (ref 8.4–10.5)
Chloride: 98 mEq/L (ref 96–112)
Creatinine, Ser: 0.85 mg/dL (ref 0.40–1.20)
GFR: 78.34 mL/min (ref >60.00)
Glucose, Bld: 89 mg/dL (ref 70–99)
Potassium: 3.7 mEq/L (ref 3.5–5.1)
Sodium: 138 mEq/L (ref 135–145)

## 2020-06-07 LAB — VITAMIN D 25 HYDROXY (VIT D DEFICIENCY, FRACTURES): VITD: 20.27 ng/mL — ABNORMAL LOW (ref 30.00–100.00)

## 2020-06-07 LAB — LIPID PANEL
Cholesterol: 193 mg/dL (ref 0–200)
HDL: 53.8 mg/dL (ref >39.00)
LDL Cholesterol: 107 mg/dL — ABNORMAL HIGH (ref 0–99)
NonHDL: 138.95
Total CHOL/HDL Ratio: 4
Triglycerides: 161 mg/dL — ABNORMAL HIGH (ref 0.0–149.0)
VLDL: 32.2 mg/dL (ref 0.0–40.0)

## 2020-06-07 LAB — MAGNESIUM: Magnesium: 1.9 mg/dL (ref 1.5–2.5)

## 2020-06-07 MED ORDER — NEBIVOLOL HCL 5 MG PO TABS: 5.0000 mg | ORAL_TABLET | Freq: Every day | ORAL | 1 refills | Status: DC

## 2020-06-07 MED ORDER — CHOLECALCIFEROL 50 MCG (2000 UT) PO TABS: 2.0000 | ORAL_TABLET | Freq: Every day | ORAL | 1 refills | Status: AC

## 2020-06-07 MED ORDER — POTASSIUM CHLORIDE ER 10 MEQ PO TBCR: 10.0000 meq | EXTENDED_RELEASE_TABLET | Freq: Two times a day (BID) | ORAL | 0 refills | Status: DC

## 2020-06-07 NOTE — Patient Instructions (Signed)
Health Maintenance, Female Adopting a healthy lifestyle and getting preventive care are important in promoting health and wellness. Ask your health care provider about:  The right schedule for you to have regular tests and exams.  Things you can do on your own to prevent diseases and keep yourself healthy. What should I know about diet, weight, and exercise? Eat a healthy diet  Eat a diet that includes plenty of vegetables, fruits, low-fat dairy products, and lean protein.  Do not eat a lot of foods that are high in solid fats, added sugars, or sodium.   Maintain a healthy weight Body mass index (BMI) is used to identify weight problems. It estimates body fat based on height and weight. Your health care provider can help determine your BMI and help you achieve or maintain a healthy weight. Get regular exercise Get regular exercise. This is one of the most important things you can do for your health. Most adults should:  Exercise for at least 150 minutes each week. The exercise should increase your heart rate and make you sweat (moderate-intensity exercise).  Do strengthening exercises at least twice a week. This is in addition to the moderate-intensity exercise.  Spend less time sitting. Even light physical activity can be beneficial. Watch cholesterol and blood lipids Have your blood tested for lipids and cholesterol at 53 years of age, then have this test every 5 years. Have your cholesterol levels checked more often if:  Your lipid or cholesterol levels are high.  You are older than 53 years of age.  You are at high risk for heart disease. What should I know about cancer screening? Depending on your health history and family history, you may need to have cancer screening at various ages. This may include screening for:  Breast cancer.  Cervical cancer.  Colorectal cancer.  Skin cancer.  Lung cancer. What should I know about heart disease, diabetes, and high blood  pressure? Blood pressure and heart disease  High blood pressure causes heart disease and increases the risk of stroke. This is more likely to develop in people who have high blood pressure readings, are of African descent, or are overweight.  Have your blood pressure checked: ? Every 3-5 years if you are 18-39 years of age. ? Every year if you are 40 years old or older. Diabetes Have regular diabetes screenings. This checks your fasting blood sugar level. Have the screening done:  Once every three years after age 40 if you are at a normal weight and have a low risk for diabetes.  More often and at a younger age if you are overweight or have a high risk for diabetes. What should I know about preventing infection? Hepatitis B If you have a higher risk for hepatitis B, you should be screened for this virus. Talk with your health care provider to find out if you are at risk for hepatitis B infection. Hepatitis C Testing is recommended for:  Everyone born from 1945 through 1965.  Anyone with known risk factors for hepatitis C. Sexually transmitted infections (STIs)  Get screened for STIs, including gonorrhea and chlamydia, if: ? You are sexually active and are younger than 53 years of age. ? You are older than 53 years of age and your health care provider tells you that you are at risk for this type of infection. ? Your sexual activity has changed since you were last screened, and you are at increased risk for chlamydia or gonorrhea. Ask your health care provider   if you are at risk.  Ask your health care provider about whether you are at high risk for HIV. Your health care provider may recommend a prescription medicine to help prevent HIV infection. If you choose to take medicine to prevent HIV, you should first get tested for HIV. You should then be tested every 3 months for as long as you are taking the medicine. Pregnancy  If you are about to stop having your period (premenopausal) and  you may become pregnant, seek counseling before you get pregnant.  Take 400 to 800 micrograms (mcg) of folic acid every day if you become pregnant.  Ask for birth control (contraception) if you want to prevent pregnancy. Osteoporosis and menopause Osteoporosis is a disease in which the bones lose minerals and strength with aging. This can result in bone fractures. If you are 65 years old or older, or if you are at risk for osteoporosis and fractures, ask your health care provider if you should:  Be screened for bone loss.  Take a calcium or vitamin D supplement to lower your risk of fractures.  Be given hormone replacement therapy (HRT) to treat symptoms of menopause. Follow these instructions at home: Lifestyle  Do not use any products that contain nicotine or tobacco, such as cigarettes, e-cigarettes, and chewing tobacco. If you need help quitting, ask your health care provider.  Do not use street drugs.  Do not share needles.  Ask your health care provider for help if you need support or information about quitting drugs. Alcohol use  Do not drink alcohol if: ? Your health care provider tells you not to drink. ? You are pregnant, may be pregnant, or are planning to become pregnant.  If you drink alcohol: ? Limit how much you use to 0-1 drink a day. ? Limit intake if you are breastfeeding.  Be aware of how much alcohol is in your drink. In the [place] one drink equals one 12 oz bottle of beer (355 mL), one 5 oz glass of wine (148 mL), or one 1 oz glass of hard liquor (44 mL). General instructions  Schedule regular health, dental, and eye exams.  Stay current with your vaccines.  Tell your health care provider if: ? You often feel depressed. ? You have ever been abused or do not feel safe at home. Summary  Adopting a healthy lifestyle and getting preventive care are important in promoting health and wellness.  Follow your health care provider's instructions about healthy  diet, exercising, and getting tested or screened for diseases.  Follow your health care provider's instructions on monitoring your cholesterol and blood pressure. This information is not intended to replace advice given to you by your health care provider. Make sure you discuss any questions you have with your health care provider. Document Revised: 02/11/2018 Document Reviewed: 02/11/2018 Elsevier Patient Education  2021 [employer] Inc.  

## 2020-06-07 NOTE — Progress Notes (Signed)
Subjective:  Patient ID: Tracy Walton, female    DOB: 10-29-1965  Age: 55 y.o. MRN: [id number]  CC: Annual Exam and Hypertension  This visit occurred during the SARS-CoV-2 public health emergency.  Safety protocols were in place, including screening questions prior to the visit, additional usage of staff PPE, and extensive cleaning of exam room while observing appropriate contact time as indicated for disinfecting solutions.    HPI Madelene Kaatz presents for a CPX and f/up -   She tells me her blood pressure has been well controlled.  She is active and denies any recent episodes of chest pain, shortness of breath, palpitations, edema, fatigue, dizziness, or lightheadedness.  Outpatient Medications Prior to Visit  Medication Sig Dispense Refill  . indapamide (LOZOL) 1.25 MG tablet TAKE 1 TABLET(1.25 MG) BY MOUTH DAILY 90 tablet 0  . Cholecalciferol 50 MCG (2000 UT) TABS Take 2 tablets (4,000 Units total) by mouth daily. 180 tablet 1  . nebivolol (BYSTOLIC) 5 MG tablet Take 1 tablet (5 mg total) by mouth daily. 90 tablet 1  . potassium chloride (KLOR-CON) 10 MEQ tablet Take 1 tablet (10 mEq total) by mouth 2 (two) times daily. 180 tablet 0   No facility-administered medications prior to visit.    ROS Review of Systems  Constitutional: Negative for chills, diaphoresis, fatigue and fever.  HENT: Negative.   Eyes: Negative.   Respiratory: Negative for cough, chest tightness, shortness of breath and wheezing.   Cardiovascular: Negative for chest pain, palpitations and leg swelling.  Gastrointestinal: Negative for abdominal pain, constipation, diarrhea, nausea and vomiting.  Endocrine: Negative.   Genitourinary: Negative.  Negative for difficulty urinating, hematuria and urgency.  Musculoskeletal: Negative for arthralgias and myalgias.  Skin: Negative.  Negative for color change and pallor.  Neurological: Negative.  Negative for dizziness, weakness and light-headedness.   Hematological: Negative for adenopathy. Does not bruise/bleed easily.  Psychiatric/Behavioral: Negative.     Objective:  BP 132/78 (BP Location: Right Arm, Patient Position: Sitting, Cuff Size: Normal)   Pulse 80   Temp 98.6 F (37 C) (Oral)   Resp 16   Ht 5\' 2"  (1.575 m)   Wt 136 lb (61.7 kg)   SpO2 96%   BMI 24.87 kg/m   BP Readings from Last 3 Encounters:  06/07/20 132/78  12/08/19 (!) 142/78  08/19/19 140/90    Wt Readings from Last 3 Encounters:  06/07/20 136 lb (61.7 kg)  12/08/19 129 lb (58.5 kg)  08/31/19 127 lb 9.6 oz (57.9 kg)    Physical Exam Vitals reviewed.  HENT:     Nose: Nose normal.     Mouth/Throat:     Mouth: Mucous membranes are moist.  Eyes:     General: No scleral icterus.    Conjunctiva/sclera: Conjunctivae normal.  Cardiovascular:     Rate and Rhythm: Normal rate and regular rhythm.     Heart sounds: No murmur heard.   Pulmonary:     Effort: Pulmonary effort is normal.     Breath sounds: No stridor. No wheezing, rhonchi or rales.  Abdominal:     General: Abdomen is flat. Bowel sounds are normal. There is no distension.     Palpations: Abdomen is soft. There is no hepatomegaly, splenomegaly or mass.     Tenderness: There is no abdominal tenderness.  Musculoskeletal:        General: Normal range of motion.     Cervical back: Neck supple.     Right lower leg: No edema.  Left lower leg: No edema.  Lymphadenopathy:     Cervical: No cervical adenopathy.  Skin:    General: Skin is warm and dry.     Coloration: Skin is not pale.  Neurological:     General: No focal deficit present.     Mental Status: She is alert.  Psychiatric:        Mood and Affect: Mood normal.        Behavior: Behavior normal.     Lab Results  Component Value Date   WBC 6.0 06/07/2020   HGB 12.9 06/07/2020   HCT 38.8 06/07/2020   PLT 203.0 06/07/2020   GLUCOSE 89 06/07/2020   CHOL 193 06/07/2020   TRIG 161.0 (H) 06/07/2020   HDL 53.80 06/07/2020    LDLCALC 107 (H) 06/07/2020   ALT 10 05/19/2019   AST 13 05/19/2019   NA 138 06/07/2020   K 3.7 06/07/2020   CL 98 06/07/2020   CREATININE 0.85 06/07/2020   BUN 10 06/07/2020   CO2 32 06/07/2020   TSH 1.14 05/19/2019    MM DIGITAL SCREENING BILATERAL  Result Date: 12/08/2019 CLINICAL DATA:  Screening. EXAM: DIGITAL SCREENING BILATERAL MAMMOGRAM WITH CAD COMPARISON:  Previous exam(s). ACR Breast Density Category c: The breast tissue is heterogeneously dense, which may obscure small masses. FINDINGS: There are no findings suspicious for malignancy. Images were processed with CAD. IMPRESSION: No mammographic evidence of malignancy. A result letter of this screening mammogram will be mailed directly to the patient. RECOMMENDATION: Screening mammogram in one year. (Code:SM-B-01Y) BI-RADS CATEGORY  1: Negative. Electronically Signed   By: Amie Portland M.D.   On: 12/08/2019 09:31    Assessment & Plan:   Hermelinda was seen today for annual exam and hypertension.  Diagnoses and all orders for this visit:  Diuretic-induced hypokalemia- Her potassium level is normal now. -     Basic metabolic panel; Future -     Magnesium; Future -     Magnesium -     Basic metabolic panel -     potassium chloride (KLOR-CON) 10 MEQ tablet; Take 1 tablet (10 mEq total) by mouth 2 (two) times daily.  Essential hypertension- Her blood pressure is adequately well controlled. -     CBC with Differential/Platelet; Future -     Basic metabolic panel; Future -     nebivolol (BYSTOLIC) 5 MG tablet; Take 1 tablet (5 mg total) by mouth daily. -     Basic metabolic panel -     CBC with Differential/Platelet -     potassium chloride (KLOR-CON) 10 MEQ tablet; Take 1 tablet (10 mEq total) by mouth 2 (two) times daily.  Screen for colon cancer -     Cologuard  Screening for cervical cancer -     Ambulatory referral to Gynecology  Vitamin D deficiency disease -     VITAMIN D 25 Hydroxy (Vit-D Deficiency, Fractures);  Future -     VITAMIN D 25 Hydroxy (Vit-D Deficiency, Fractures) -     Cholecalciferol 50 MCG (2000 UT) TABS; Take 2 tablets (4,000 Units total) by mouth daily.  Routine general medical examination at a health care facility- Exam completed, labs reviewed, vaccines reviewed, I encouraged her to complete her cancer screenings, patient education material was given. -     Lipid panel; Future -     Hepatitis C antibody; Future -     Hepatitis C antibody -     Lipid panel   I am having Tracy Walton  maintain her indapamide, nebivolol, Cholecalciferol, and potassium chloride.  Meds ordered this encounter  Medications  . nebivolol (BYSTOLIC) 5 MG tablet    Sig: Take 1 tablet (5 mg total) by mouth daily.    Dispense:  90 tablet    Refill:  1  . Cholecalciferol 50 MCG (2000 UT) TABS    Sig: Take 2 tablets (4,000 Units total) by mouth daily.    Dispense:  180 tablet    Refill:  1  . potassium chloride (KLOR-CON) 10 MEQ tablet    Sig: Take 1 tablet (10 mEq total) by mouth 2 (two) times daily.    Dispense:  180 tablet    Refill:  0     Follow-up: Return in about 6 months (around 12/07/2020).  Sanda Linger, MD

## 2020-06-08 ENCOUNTER — Encounter: Payer: Self-pay | Admitting: Internal Medicine

## 2020-06-08 LAB — HEPATITIS C ANTIBODY
Hepatitis C Ab: NONREACTIVE
SIGNAL TO CUT-OFF: 0.02 (ref <1.00)

## 2020-06-27 NOTE — Progress Notes (Signed)
   Covid-19 Vaccination [facility]  Name:  Anatasia Tino    MRN: [id number] DOB: 12-21-1965  06/27/2020  Ms. Safran was observed post Covid-19 immunization for 15 minutes without incident. She was provided with Vaccine Information Sheet and instruction to access the V-Safe system.   Ms. Mulvaney was instructed to call 911 with any severe reactions post vaccine: Marland Kitchen Difficulty breathing  . Swelling of face and throat  . A fast heartbeat  . A bad rash all over body  . Dizziness and weakness   Immunizations Administered    Name Date Dose VIS Date Route   Moderna Covid-19 Booster Vaccine 02/21/2020 11:04 AM 0.25 mL 12/22/2019 Intramuscular   Manufacturer: Moderna   Lot: [id number]   NDC: 15726-203-55

## 2020-08-29 ENCOUNTER — Other Ambulatory Visit: Payer: Self-pay | Admitting: Internal Medicine

## 2020-08-29 DIAGNOSIS — I10 Essential (primary) hypertension: Secondary | ICD-10-CM

## 2020-09-18 ENCOUNTER — Other Ambulatory Visit: Payer: Self-pay | Admitting: Internal Medicine

## 2020-09-18 DIAGNOSIS — I10 Essential (primary) hypertension: Secondary | ICD-10-CM

## 2020-11-09 LAB — HM DIABETES EYE EXAM

## 2021-03-18 ENCOUNTER — Telehealth: Payer: Self-pay | Admitting: Internal Medicine

## 2021-03-18 DIAGNOSIS — I10 Essential (primary) hypertension: Secondary | ICD-10-CM

## 2021-03-19 NOTE — Telephone Encounter (Signed)
Patient requesting a short supply of indapamide (LOZOL) 1.25 MG tablet until next appt w/ provider on 04-25-2021

## 2021-03-20 ENCOUNTER — Other Ambulatory Visit: Payer: Self-pay | Admitting: Internal Medicine

## 2021-03-20 DIAGNOSIS — I10 Essential (primary) hypertension: Secondary | ICD-10-CM

## 2021-03-20 MED ORDER — INDAPAMIDE 1.25 MG PO TABS: ORAL_TABLET | ORAL | 0 refills | Status: DC

## 2021-04-18 ENCOUNTER — Other Ambulatory Visit: Payer: Self-pay | Admitting: Internal Medicine

## 2021-04-18 DIAGNOSIS — I10 Essential (primary) hypertension: Secondary | ICD-10-CM

## 2021-04-25 ENCOUNTER — Other Ambulatory Visit: Payer: Self-pay

## 2021-04-25 ENCOUNTER — Ambulatory Visit: Payer: 59 | Admitting: Internal Medicine

## 2021-04-25 ENCOUNTER — Encounter: Payer: Self-pay | Admitting: Internal Medicine

## 2021-04-25 VITALS — BP 144/96 | HR 105 | Temp 98.4°F | Ht 62.0 in | Wt 140.0 lb

## 2021-04-25 DIAGNOSIS — Z1211 Encounter for screening for malignant neoplasm of colon: Secondary | ICD-10-CM

## 2021-04-25 DIAGNOSIS — E876 Hypokalemia: Secondary | ICD-10-CM

## 2021-04-25 DIAGNOSIS — T502X5A Adverse effect of carbonic-anhydrase inhibitors, benzothiadiazides and other diuretics, initial encounter: Secondary | ICD-10-CM | POA: Diagnosis not present

## 2021-04-25 DIAGNOSIS — I10 Essential (primary) hypertension: Secondary | ICD-10-CM | POA: Diagnosis not present

## 2021-04-25 DIAGNOSIS — Z23 Encounter for immunization: Secondary | ICD-10-CM

## 2021-04-25 DIAGNOSIS — Z124 Encounter for screening for malignant neoplasm of cervix: Secondary | ICD-10-CM

## 2021-04-25 DIAGNOSIS — Z1231 Encounter for screening mammogram for malignant neoplasm of breast: Secondary | ICD-10-CM

## 2021-04-25 LAB — CBC WITH DIFFERENTIAL/PLATELET
Basophils Absolute: 0 10*3/uL (ref 0.0–0.1)
Basophils Relative: 0.7 % (ref 0.0–3.0)
Eosinophils Absolute: 0.2 10*3/uL (ref 0.0–0.7)
Eosinophils Relative: 2.5 % (ref 0.0–5.0)
HCT: 39.9 % (ref 36.0–46.0)
Hemoglobin: 13.1 g/dL (ref 12.0–15.0)
Lymphocytes Relative: 28.4 % (ref 12.0–46.0)
Lymphs Abs: 1.8 10*3/uL (ref 0.7–4.0)
MCHC: 32.9 g/dL (ref 30.0–36.0)
MCV: 86.2 fl (ref 78.0–100.0)
Monocytes Absolute: 0.6 10*3/uL (ref 0.1–1.0)
Monocytes Relative: 9.8 % (ref 3.0–12.0)
Neutro Abs: 3.7 10*3/uL (ref 1.4–7.7)
Neutrophils Relative %: 58.6 % (ref 43.0–77.0)
Platelets: 213 10*3/uL (ref 150.0–400.0)
RBC: 4.62 Mil/uL (ref 3.87–5.11)
RDW: 14.2 % (ref 11.5–15.5)
WBC: 6.3 10*3/uL (ref 4.0–10.5)

## 2021-04-25 MED ORDER — INDAPAMIDE 1.25 MG PO TABS: ORAL_TABLET | ORAL | 0 refills | Status: DC

## 2021-04-25 MED ORDER — NEBIVOLOL HCL 10 MG PO TABS: 10.0000 mg | ORAL_TABLET | Freq: Every day | ORAL | 0 refills | Status: DC

## 2021-04-25 NOTE — Progress Notes (Signed)
Subjective:  Patient ID: Tracy Walton, female    DOB: 1965-07-25  Age: 56 y.o. MRN: [id number]  CC: Hypertension  This visit occurred during the SARS-CoV-2 public health emergency.  Safety protocols were in place, including screening questions prior to the visit, additional usage of staff PPE, and extensive cleaning of exam room while observing appropriate contact time as indicated for disinfecting solutions.    HPI Tracy Walton presents for f/up -  She is taking indapamide but not nebivolol.  She does not monitor her blood pressure.  She denies headache, blurred vision, chest pain, shortness of breath, diaphoresis, palpitations, or edema.   Outpatient Medications Prior to Visit  Medication Sig Dispense Refill   Cholecalciferol 50 MCG (2000 UT) TABS Take 2 tablets (4,000 Units total) by mouth daily. 180 tablet 1   indapamide (LOZOL) 1.25 MG tablet TAKE 1 TABLET BY MOUTH EVERY DAY. 30 tablet 0   potassium chloride (KLOR-CON) 10 MEQ tablet Take 1 tablet (10 mEq total) by mouth 2 (two) times daily. (Patient not taking: Reported on 04/25/2021) 180 tablet 0   nebivolol (BYSTOLIC) 5 MG tablet Take 1 tablet (5 mg total) by mouth daily. (Patient not taking: Reported on 04/25/2021) 90 tablet 1   No facility-administered medications prior to visit.    ROS Review of Systems  Constitutional:  Negative for diaphoresis and fatigue.  HENT: Negative.    Eyes: Negative.   Respiratory:  Negative for cough, chest tightness, shortness of breath and wheezing.   Cardiovascular:  Negative for chest pain, palpitations and leg swelling.  Gastrointestinal:  Negative for abdominal pain, diarrhea, nausea and vomiting.  Endocrine: Negative.   Genitourinary: Negative.  Negative for difficulty urinating.  Musculoskeletal: Negative.  Negative for arthralgias and myalgias.  Skin: Negative.   Neurological:  Negative for dizziness, weakness, light-headedness and numbness.  Hematological:  Negative for adenopathy.  Does not bruise/bleed easily.  Psychiatric/Behavioral: Negative.     Objective:  BP (!) 144/96    Pulse (!) 105    Temp 98.4 F (36.9 C) (Oral)    Ht 5\' 2"  (1.575 m)    Wt 140 lb (63.5 kg)    SpO2 97%    BMI 25.61 kg/m   BP Readings from Last 3 Encounters:  04/25/21 (!) 144/96  06/07/20 132/78  12/08/19 (!) 142/78    Wt Readings from Last 3 Encounters:  04/25/21 140 lb (63.5 kg)  06/07/20 136 lb (61.7 kg)  12/08/19 129 lb (58.5 kg)    Physical Exam Vitals reviewed.  HENT:     Nose: Nose normal.     Mouth/Throat:     Mouth: Mucous membranes are moist.  Eyes:     General: No scleral icterus.    Conjunctiva/sclera: Conjunctivae normal.  Cardiovascular:     Rate and Rhythm: Normal rate and regular rhythm.     Heart sounds: No murmur heard. Pulmonary:     Effort: Pulmonary effort is normal.     Breath sounds: No stridor. No wheezing, rhonchi or rales.  Abdominal:     General: Abdomen is flat.     Palpations: There is no mass.     Tenderness: There is no abdominal tenderness. There is no guarding.     Hernia: No hernia is present.  Musculoskeletal:        General: Normal range of motion.     Cervical back: Neck supple.     Right lower leg: No edema.     Left lower leg: No edema.  Lymphadenopathy:  Cervical: No cervical adenopathy.  Skin:    General: Skin is warm and dry.  Neurological:     General: No focal deficit present.     Mental Status: She is alert.  Psychiatric:        Mood and Affect: Mood normal.        Behavior: Behavior normal.    Lab Results  Component Value Date   WBC 6.3 04/25/2021   HGB 13.1 04/25/2021   HCT 39.9 04/25/2021   PLT 213.0 04/25/2021   GLUCOSE 76 04/25/2021   CHOL 193 06/07/2020   TRIG 161.0 (H) 06/07/2020   HDL 53.80 06/07/2020   LDLCALC 107 (H) 06/07/2020   ALT 10 05/19/2019   AST 13 05/19/2019   NA 139 04/25/2021   K 3.9 04/25/2021   CL 98 04/25/2021   CREATININE 0.93 04/25/2021   BUN 17 04/25/2021   CO2 35 (H)  04/25/2021   TSH 1.51 04/25/2021    MM DIGITAL SCREENING BILATERAL  Result Date: 12/08/2019 CLINICAL DATA:  Screening. EXAM: DIGITAL SCREENING BILATERAL MAMMOGRAM WITH CAD COMPARISON:  Previous exam(s). ACR Breast Density Category c: The breast tissue is heterogeneously dense, which may obscure small masses. FINDINGS: There are no findings suspicious for malignancy. Images were processed with CAD. IMPRESSION: No mammographic evidence of malignancy. A result letter of this screening mammogram will be mailed directly to the patient. RECOMMENDATION: Screening mammogram in one year. (Code:SM-B-01Y) BI-RADS CATEGORY  1: Negative. Electronically Signed   By: Amie Portland M.D.   On: 12/08/2019 09:31    Assessment & Plan:   Kumari was seen today for hypertension.  Diagnoses and all orders for this visit:  Essential hypertension- Her blood pressure is not adequately well controlled.  I have asked her to take both nebivolol and indapamide.  Her labs are negative for secondary causes or endorgan damage. -     Basic metabolic panel; Future -     CBC with Differential/Platelet; Future -     nebivolol (BYSTOLIC) 10 MG tablet; Take 1 tablet (10 mg total) by mouth daily. -     indapamide (LOZOL) 1.25 MG tablet; TAKE 1 TABLET BY MOUTH EVERY DAY. -     TSH; Future -     TSH -     CBC with Differential/Platelet -     Basic metabolic panel  Diuretic-induced hypokalemia- Her potassium level is normal. -     Basic metabolic panel; Future -     Basic metabolic panel  Screen for colon cancer  Screening for cervical cancer -     Ambulatory referral to Gynecology  Visit for screening mammogram -     MM DIGITAL SCREENING BILATERAL; Future  Other orders -     Varicella-zoster vaccine IM (Shingrix)   I have discontinued [name]'s nebivolol. I am also having her start on nebivolol. Additionally, I am having her maintain her Cholecalciferol, potassium chloride, and indapamide.  Meds ordered this  encounter  Medications   nebivolol (BYSTOLIC) 10 MG tablet    Sig: Take 1 tablet (10 mg total) by mouth daily.    Dispense:  90 tablet    Refill:  0   indapamide (LOZOL) 1.25 MG tablet    Sig: TAKE 1 TABLET BY MOUTH EVERY DAY.    Dispense:  90 tablet    Refill:  0     Follow-up: Return in about 3 months (around 07/23/2021).  Sanda Linger, MD

## 2021-04-25 NOTE — Patient Instructions (Signed)

## 2021-04-26 LAB — TSH: TSH: 1.51 u[IU]/mL (ref 0.35–5.50)

## 2021-04-26 LAB — BASIC METABOLIC PANEL
BUN: 17 mg/dL (ref 6–23)
CO2: 35 mEq/L — ABNORMAL HIGH (ref 19–32)
Calcium: 10.1 mg/dL (ref 8.4–10.5)
Chloride: 98 mEq/L (ref 96–112)
Creatinine, Ser: 0.93 mg/dL (ref 0.40–1.20)
GFR: 69.89 mL/min (ref >60.00)
Glucose, Bld: 76 mg/dL (ref 70–99)
Potassium: 3.9 mEq/L (ref 3.5–5.1)
Sodium: 139 mEq/L (ref 135–145)

## 2021-05-20 ENCOUNTER — Other Ambulatory Visit: Payer: Self-pay | Admitting: Internal Medicine

## 2021-05-20 DIAGNOSIS — I10 Essential (primary) hypertension: Secondary | ICD-10-CM

## 2021-07-24 ENCOUNTER — Other Ambulatory Visit: Payer: Self-pay | Admitting: Internal Medicine

## 2021-07-24 DIAGNOSIS — E876 Hypokalemia: Secondary | ICD-10-CM

## 2021-07-24 DIAGNOSIS — I10 Essential (primary) hypertension: Secondary | ICD-10-CM

## 2021-07-31 ENCOUNTER — Ambulatory Visit: Payer: 59 | Admitting: Internal Medicine

## 2021-08-16 ENCOUNTER — Ambulatory Visit: Payer: 59 | Admitting: Internal Medicine

## 2021-08-16 ENCOUNTER — Encounter: Payer: Self-pay | Admitting: Internal Medicine

## 2021-08-16 VITALS — BP 140/86 | HR 80 | Temp 98.4°F | Ht 62.0 in | Wt 139.0 lb

## 2021-08-16 DIAGNOSIS — E876 Hypokalemia: Secondary | ICD-10-CM

## 2021-08-16 DIAGNOSIS — I10 Essential (primary) hypertension: Secondary | ICD-10-CM | POA: Diagnosis not present

## 2021-08-16 DIAGNOSIS — Z23 Encounter for immunization: Secondary | ICD-10-CM

## 2021-08-16 DIAGNOSIS — Z0000 Encounter for general adult medical examination without abnormal findings: Secondary | ICD-10-CM

## 2021-08-16 DIAGNOSIS — Z124 Encounter for screening for malignant neoplasm of cervix: Secondary | ICD-10-CM

## 2021-08-16 DIAGNOSIS — Z1211 Encounter for screening for malignant neoplasm of colon: Secondary | ICD-10-CM

## 2021-08-16 DIAGNOSIS — T502X5A Adverse effect of carbonic-anhydrase inhibitors, benzothiadiazides and other diuretics, initial encounter: Secondary | ICD-10-CM | POA: Diagnosis not present

## 2021-08-16 DIAGNOSIS — Z1231 Encounter for screening mammogram for malignant neoplasm of breast: Secondary | ICD-10-CM

## 2021-08-16 LAB — URINALYSIS, ROUTINE W REFLEX MICROSCOPIC
Bilirubin Urine: NEGATIVE
Ketones, ur: NEGATIVE
Nitrite: NEGATIVE
Specific Gravity, Urine: <=1.005 — AB (ref 1.000–1.030)
Total Protein, Urine: NEGATIVE
Urine Glucose: NEGATIVE
Urobilinogen, UA: 0.2 (ref 0.0–1.0)
pH: 6.5 (ref 5.0–8.0)

## 2021-08-16 LAB — LIPID PANEL
Cholesterol: 176 mg/dL (ref 0–200)
HDL: 49.7 mg/dL (ref >39.00)
LDL Cholesterol: 96 mg/dL (ref 0–99)
NonHDL: 126.4
Total CHOL/HDL Ratio: 4
Triglycerides: 151 mg/dL — ABNORMAL HIGH (ref 0.0–149.0)
VLDL: 30.2 mg/dL (ref 0.0–40.0)

## 2021-08-16 LAB — BASIC METABOLIC PANEL
BUN: 12 mg/dL (ref 6–23)
CO2: 33 mEq/L — ABNORMAL HIGH (ref 19–32)
Calcium: 9.8 mg/dL (ref 8.4–10.5)
Chloride: 97 mEq/L (ref 96–112)
Creatinine, Ser: 0.88 mg/dL (ref 0.40–1.20)
GFR: 74.52 mL/min (ref >60.00)
Glucose, Bld: 84 mg/dL (ref 70–99)
Potassium: 3.5 mEq/L (ref 3.5–5.1)
Sodium: 137 mEq/L (ref 135–145)

## 2021-08-16 MED ORDER — INDAPAMIDE 1.25 MG PO TABS
1.2500 mg | ORAL_TABLET | Freq: Every day | ORAL | 1 refills | Status: DC
Start: 2021-08-16 — End: 2022-02-18

## 2021-08-16 MED ORDER — NEBIVOLOL HCL 10 MG PO TABS: 10.0000 mg | ORAL_TABLET | Freq: Every day | ORAL | 1 refills | Status: DC

## 2021-08-16 MED ORDER — POTASSIUM CHLORIDE ER 10 MEQ PO TBCR: 10.0000 meq | EXTENDED_RELEASE_TABLET | Freq: Two times a day (BID) | ORAL | 1 refills | Status: DC

## 2021-08-16 NOTE — Progress Notes (Unsigned)
Subjective:  Patient ID: Tracy Walton, female    DOB: Nov 20, 1965  Age: 56 y.o. MRN: [id number]  CC: Annual Exam and Hypertension   HPI Tracy Walton presents for f/up -   She is active and denies chest pain, shortness of breath, diaphoresis, dizziness, lightheadedness, or edema.  Outpatient Medications Prior to Visit  Medication Sig Dispense Refill   Cholecalciferol 50 MCG (2000 UT) TABS Take 2 tablets (4,000 Units total) by mouth daily. 180 tablet 1   indapamide (LOZOL) 1.25 MG tablet TAKE 1 TABLET BY MOUTH EVERY DAY 90 tablet 0   nebivolol (BYSTOLIC) 10 MG tablet Take 1 tablet (10 mg total) by mouth daily. 90 tablet 0   potassium chloride (KLOR-CON) 10 MEQ tablet TAKE 1 TABLET(10 MEQ) BY MOUTH TWICE DAILY 180 tablet 0   No facility-administered medications prior to visit.    ROS Review of Systems  Constitutional:  Negative for diaphoresis, fatigue and unexpected weight change.  HENT: Negative.    Eyes: Negative.   Respiratory:  Negative for cough, chest tightness, shortness of breath and wheezing.   Cardiovascular:  Negative for chest pain, palpitations and leg swelling.  Gastrointestinal:  Negative for abdominal pain, diarrhea and nausea.  Endocrine: Negative.   Genitourinary: Negative.  Negative for difficulty urinating.  Musculoskeletal: Negative.   Skin: Negative.   Neurological: Negative.  Negative for dizziness, weakness and light-headedness.  Hematological:  Negative for adenopathy. Does not bruise/bleed easily.  Psychiatric/Behavioral: Negative.      Objective:  BP 140/86 (BP Location: Left Arm, Patient Position: Sitting, Cuff Size: Large)   Pulse 80   Temp 98.4 F (36.9 C) (Oral)   Ht 5\' 2"  (1.575 m)   Wt 139 lb (63 kg)   SpO2 97%   BMI 25.42 kg/m   BP Readings from Last 3 Encounters:  08/16/21 140/86  04/25/21 (!) 144/96  06/07/20 132/78    Wt Readings from Last 3 Encounters:  08/16/21 139 lb (63 kg)  04/25/21 140 lb (63.5 kg)  06/07/20 136 lb  (61.7 kg)    Physical Exam Vitals reviewed.  HENT:     Nose: Nose normal.     Mouth/Throat:     Mouth: Mucous membranes are moist.  Eyes:     General: No scleral icterus.    Conjunctiva/sclera: Conjunctivae normal.  Cardiovascular:     Rate and Rhythm: Normal rate and regular rhythm.     Heart sounds: No murmur heard. Pulmonary:     Effort: Pulmonary effort is normal.     Breath sounds: No stridor. No wheezing, rhonchi or rales.  Abdominal:     General: Abdomen is flat.     Palpations: There is no mass.     Tenderness: There is no abdominal tenderness. There is no guarding or rebound.     Hernia: No hernia is present.  Musculoskeletal:        General: Normal range of motion.     Cervical back: Neck supple.     Right lower leg: No edema.     Left lower leg: No edema.  Lymphadenopathy:     Cervical: No cervical adenopathy.  Skin:    General: Skin is warm and dry.  Neurological:     General: No focal deficit present.     Mental Status: She is alert. Mental status is at baseline.  Psychiatric:        Mood and Affect: Mood normal.        Behavior: Behavior normal.  Lab Results  Component Value Date   WBC 6.3 04/25/2021   HGB 13.1 04/25/2021   HCT 39.9 04/25/2021   PLT 213.0 04/25/2021   GLUCOSE 84 08/16/2021   CHOL 176 08/16/2021   TRIG 151.0 (H) 08/16/2021   HDL 49.70 08/16/2021   LDLCALC 96 08/16/2021   ALT 10 05/19/2019   AST 13 05/19/2019   NA 137 08/16/2021   K 3.5 08/16/2021   CL 97 08/16/2021   CREATININE 0.88 08/16/2021   BUN 12 08/16/2021   CO2 33 (H) 08/16/2021   TSH 1.51 04/25/2021    MM DIGITAL SCREENING BILATERAL  Result Date: 12/08/2019 CLINICAL DATA:  Screening. EXAM: DIGITAL SCREENING BILATERAL MAMMOGRAM WITH CAD COMPARISON:  Previous exam(s). ACR Breast Density Category c: The breast tissue is heterogeneously dense, which may obscure small masses. FINDINGS: There are no findings suspicious for malignancy. Images were processed with CAD.  IMPRESSION: No mammographic evidence of malignancy. A result letter of this screening mammogram will be mailed directly to the patient. RECOMMENDATION: Screening mammogram in one year. (Code:SM-B-01Y) BI-RADS CATEGORY  1: Negative. Electronically Signed   By: Amie Portland M.D.   On: 12/08/2019 09:31    Assessment & Plan:   Tracy Walton was seen today for annual exam and hypertension.  Diagnoses and all orders for this visit:  Essential hypertension- She has not achieved her blood pressure goal of 130/80.  I have asked her to improve her compliance with antihypertensives and to continue working on her lifestyle modifications. -     Basic metabolic panel; Future -     Basic metabolic panel -     Urinalysis, Routine w reflex microscopic; Future -     Urinalysis, Routine w reflex microscopic -     indapamide (LOZOL) 1.25 MG tablet; Take 1 tablet (1.25 mg total) by mouth daily. -     nebivolol (BYSTOLIC) 10 MG tablet; Take 1 tablet (10 mg total) by mouth daily. -     potassium chloride (KLOR-CON) 10 MEQ tablet; Take 1 tablet (10 mEq total) by mouth 2 (two) times daily.  Diuretic-induced hypokalemia -     Basic metabolic panel; Future -     Basic metabolic panel -     potassium chloride (KLOR-CON) 10 MEQ tablet; Take 1 tablet (10 mEq total) by mouth 2 (two) times daily.  Routine general medical examination at a health care facility- Exam completed, labs reviewed-statin therapy is not indicated., vaccines reviewed and updated, cancer screenings addressed, patient education was given. -     Lipid panel; Future -     Lipid panel  Screen for colon cancer -     Cancel: Ambulatory referral to Gynecology -     Cologuard  Screening for cervical cancer -     Cancel: Cologuard -     Ambulatory referral to Gynecology  Visit for screening mammogram -     MM DIGITAL SCREENING BILATERAL; Future  Other orders -     Tdap vaccine greater than or equal to 7yo IM -     Varicella-zoster vaccine IM  (Shingrix)   I have changed Tracy Walton's indapamide and potassium chloride. I am also having her maintain her Cholecalciferol and nebivolol.  Meds ordered this encounter  Medications   indapamide (LOZOL) 1.25 MG tablet    Sig: Take 1 tablet (1.25 mg total) by mouth daily.    Dispense:  90 tablet    Refill:  1   nebivolol (BYSTOLIC) 10 MG tablet    Sig: Take  1 tablet (10 mg total) by mouth daily.    Dispense:  90 tablet    Refill:  1   potassium chloride (KLOR-CON) 10 MEQ tablet    Sig: Take 1 tablet (10 mEq total) by mouth 2 (two) times daily.    Dispense:  180 tablet    Refill:  1     Follow-up: Return in about 6 months (around 02/15/2022).  Sanda Linger, MD

## 2021-08-16 NOTE — Patient Instructions (Signed)

## 2021-09-12 LAB — COLOGUARD

## 2021-11-20 ENCOUNTER — Telehealth: Payer: Self-pay

## 2021-11-20 NOTE — Telephone Encounter (Addendum)
Key: [id number] approved through 11/21/2022.

## 2022-01-06 LAB — COLOGUARD: COLOGUARD: NEGATIVE

## 2022-02-18 ENCOUNTER — Encounter: Payer: Self-pay | Admitting: Internal Medicine

## 2022-02-18 ENCOUNTER — Ambulatory Visit: Payer: 59 | Admitting: Internal Medicine

## 2022-02-18 VITALS — BP 138/84 | HR 80 | Temp 98.8°F | Resp 16 | Ht 62.0 in | Wt 139.0 lb

## 2022-02-18 DIAGNOSIS — Z23 Encounter for immunization: Secondary | ICD-10-CM

## 2022-02-18 DIAGNOSIS — E876 Hypokalemia: Secondary | ICD-10-CM | POA: Diagnosis not present

## 2022-02-18 DIAGNOSIS — I10 Essential (primary) hypertension: Secondary | ICD-10-CM

## 2022-02-18 DIAGNOSIS — Z1231 Encounter for screening mammogram for malignant neoplasm of breast: Secondary | ICD-10-CM

## 2022-02-18 DIAGNOSIS — T502X5A Adverse effect of carbonic-anhydrase inhibitors, benzothiadiazides and other diuretics, initial encounter: Secondary | ICD-10-CM

## 2022-02-18 DIAGNOSIS — Z124 Encounter for screening for malignant neoplasm of cervix: Secondary | ICD-10-CM

## 2022-02-18 LAB — BASIC METABOLIC PANEL
BUN: 18 mg/dL (ref 6–23)
CO2: 30 mEq/L (ref 19–32)
Calcium: 9.7 mg/dL (ref 8.4–10.5)
Chloride: 100 mEq/L (ref 96–112)
Creatinine, Ser: 0.95 mg/dL (ref 0.40–1.20)
GFR: 67.74 mL/min (ref >60.00)
Glucose, Bld: 89 mg/dL (ref 70–99)
Potassium: 3.5 mEq/L (ref 3.5–5.1)
Sodium: 139 mEq/L (ref 135–145)

## 2022-02-18 LAB — CBC WITH DIFFERENTIAL/PLATELET
Basophils Absolute: 0 10*3/uL (ref 0.0–0.1)
Basophils Relative: 0.3 % (ref 0.0–3.0)
Eosinophils Absolute: 0 10*3/uL (ref 0.0–0.7)
Eosinophils Relative: 0.5 % (ref 0.0–5.0)
HCT: 37.6 % (ref 36.0–46.0)
Hemoglobin: 12.5 g/dL (ref 12.0–15.0)
Lymphocytes Relative: 23.8 % (ref 12.0–46.0)
Lymphs Abs: 1.6 10*3/uL (ref 0.7–4.0)
MCHC: 33.2 g/dL (ref 30.0–36.0)
MCV: 86.2 fl (ref 78.0–100.0)
Monocytes Absolute: 0.6 10*3/uL (ref 0.1–1.0)
Monocytes Relative: 8.9 % (ref 3.0–12.0)
Neutro Abs: 4.5 10*3/uL (ref 1.4–7.7)
Neutrophils Relative %: 66.5 % (ref 43.0–77.0)
Platelets: 232 10*3/uL (ref 150.0–400.0)
RBC: 4.37 Mil/uL (ref 3.87–5.11)
RDW: 14.7 % (ref 11.5–15.5)
WBC: 6.7 10*3/uL (ref 4.0–10.5)

## 2022-02-18 LAB — HEPATIC FUNCTION PANEL
ALT: 11 U/L (ref 0–35)
AST: 13 U/L (ref 0–37)
Albumin: 4.3 g/dL (ref 3.5–5.2)
Alkaline Phosphatase: 61 U/L (ref 39–117)
Bilirubin, Direct: 0 mg/dL (ref 0.0–0.3)
Total Bilirubin: 0.3 mg/dL (ref 0.2–1.2)
Total Protein: 7.6 g/dL (ref 6.0–8.3)

## 2022-02-18 MED ORDER — POTASSIUM CHLORIDE ER 10 MEQ PO TBCR: 10.0000 meq | EXTENDED_RELEASE_TABLET | Freq: Two times a day (BID) | ORAL | 1 refills | Status: DC

## 2022-02-18 MED ORDER — INDAPAMIDE 1.25 MG PO TABS: 1.2500 mg | ORAL_TABLET | Freq: Every day | ORAL | 1 refills | Status: DC

## 2022-02-18 MED ORDER — NEBIVOLOL HCL 10 MG PO TABS: 10.0000 mg | ORAL_TABLET | Freq: Every day | ORAL | 1 refills | Status: DC

## 2022-02-18 NOTE — Patient Instructions (Signed)
Hypertension, Adult High blood pressure (hypertension) is when the force of blood pumping through the arteries is too strong. The arteries are the blood vessels that carry blood from the heart throughout the body. Hypertension forces the heart to work harder to pump blood and may cause arteries to become narrow or stiff. Untreated or uncontrolled hypertension can lead to a heart attack, heart failure, a stroke, kidney disease, and other problems. A blood pressure reading consists of a higher number over a lower number. Ideally, your blood pressure should be below 120/80. The first ("top") number is called the systolic pressure. It is a measure of the pressure in your arteries as your heart beats. The second ("bottom") number is called the diastolic pressure. It is a measure of the pressure in your arteries as the heart relaxes. What are the causes? The exact cause of this condition is not known. There are some conditions that result in high blood pressure. What increases the risk? Certain factors may make you more likely to develop high blood pressure. Some of these risk factors are under your control, including: Smoking. Not getting enough exercise or physical activity. Being overweight. Having too much fat, sugar, calories, or salt (sodium) in your diet. Drinking too much alcohol. Other risk factors include: Having a personal history of heart disease, diabetes, high cholesterol, or kidney disease. Stress. Having a family history of high blood pressure and high cholesterol. Having obstructive sleep apnea. Age. The risk increases with age. What are the signs or symptoms? High blood pressure may not cause symptoms. Very high blood pressure (hypertensive crisis) may cause: Headache. Fast or irregular heartbeats (palpitations). Shortness of breath. Nosebleed. Nausea and vomiting. Vision changes. Severe chest pain, dizziness, and seizures. How is this diagnosed? This condition is diagnosed by  measuring your blood pressure while you are seated, with your arm resting on a flat surface, your legs uncrossed, and your feet flat on the floor. The cuff of the blood pressure monitor will be placed directly against the skin of your upper arm at the level of your heart. Blood pressure should be measured at least twice using the same arm. Certain conditions can cause a difference in blood pressure between your right and left arms. If you have a high blood pressure reading during one visit or you have normal blood pressure with other risk factors, you may be asked to: Return on a different day to have your blood pressure checked again. Monitor your blood pressure at home for 1 week or longer. If you are diagnosed with hypertension, you may have other blood or imaging tests to help your health care provider understand your overall risk for other conditions. How is this treated? This condition is treated by making healthy lifestyle changes, such as eating healthy foods, exercising more, and reducing your alcohol intake. You may be referred for counseling on a healthy diet and physical activity. Your health care provider may prescribe medicine if lifestyle changes are not enough to get your blood pressure under control and if: Your systolic blood pressure is above 130. Your diastolic blood pressure is above 80. Your personal target blood pressure may vary depending on your medical conditions, your age, and other factors. Follow these instructions at home: Eating and drinking  Eat a diet that is high in fiber and potassium, and low in sodium, added sugar, and fat. An example of this eating plan is called the DASH diet. DASH stands for Dietary Approaches to Stop Hypertension. To eat this way: Eat   plenty of fresh fruits and vegetables. Try to fill one half of your plate at each meal with fruits and vegetables. Eat whole grains, such as whole-wheat pasta, brown rice, or whole-grain bread. Fill about one  fourth of your plate with whole grains. Eat or drink low-fat dairy products, such as skim milk or low-fat yogurt. Avoid fatty cuts of meat, processed or cured meats, and poultry with skin. Fill about one fourth of your plate with lean proteins, such as fish, chicken without skin, beans, eggs, or tofu. Avoid pre-made and processed foods. These tend to be higher in sodium, added sugar, and fat. Reduce your daily sodium intake. Many people with hypertension should eat less than 1,500 mg of sodium a day. Do not drink alcohol if: Your health care provider tells you not to drink. You are pregnant, may be pregnant, or are planning to become pregnant. If you drink alcohol: Limit how much you have to: 0-1 drink a day for women. 0-2 drinks a day for men. Know how much alcohol is in your drink. In the [place] one drink equals one 12 oz bottle of beer (355 mL), one 5 oz glass of wine (148 mL), or one 1 oz glass of hard liquor (44 mL). Lifestyle  Work with your health care provider to maintain a healthy body weight or to lose weight. Ask what an ideal weight is for you. Get at least 30 minutes of exercise that causes your heart to beat faster (aerobic exercise) most days of the week. Activities may include walking, swimming, or biking. Include exercise to strengthen your muscles (resistance exercise), such as Pilates or lifting weights, as part of your weekly exercise routine. Try to do these types of exercises for 30 minutes at least 3 days a week. Do not use any products that contain nicotine or tobacco. These products include cigarettes, chewing tobacco, and vaping devices, such as e-cigarettes. If you need help quitting, ask your health care provider. Monitor your blood pressure at home as told by your health care provider. Keep all follow-up visits. This is important. Medicines Take over-the-counter and prescription medicines only as told by your health care provider. Follow directions carefully. Blood  pressure medicines must be taken as prescribed. Do not skip doses of blood pressure medicine. Doing this puts you at risk for problems and can make the medicine less effective. Ask your health care provider about side effects or reactions to medicines that you should watch for. Contact a health care provider if you: Think you are having a reaction to a medicine you are taking. Have headaches that keep coming back (recurring). Feel dizzy. Have swelling in your ankles. Have trouble with your vision. Get help right away if you: Develop a severe headache or confusion. Have unusual weakness or numbness. Feel faint. Have severe pain in your chest or abdomen. Vomit repeatedly. Have trouble breathing. These symptoms may be an emergency. Get help right away. Call 911. Do not wait to see if the symptoms will go away. Do not drive yourself to the hospital. Summary Hypertension is when the force of blood pumping through your arteries is too strong. If this condition is not controlled, it may put you at risk for serious complications. Your personal target blood pressure may vary depending on your medical conditions, your age, and other factors. For most people, a normal blood pressure is less than 120/80. Hypertension is treated with lifestyle changes, medicines, or a combination of both. Lifestyle changes include losing weight, eating a healthy,   low-sodium diet, exercising more, and limiting alcohol. This information is not intended to replace advice given to you by your health care provider. Make sure you discuss any questions you have with your health care provider. Document Revised: 12/26/2020 Document Reviewed: 12/26/2020 [employer] Patient Education  2023 [employer]  

## 2022-02-18 NOTE — Progress Notes (Unsigned)
Subjective:  Patient ID: Tracy Walton, female    DOB: 1965/12/29  Age: 56 y.o. MRN: [id number]  CC: Hypertension   HPI Tracy Walton presents for f/up -  She exercises every day.  She has good endurance.  She denies chest pain, shortness of breath, diaphoresis, dizziness, lightheadedness, or edema.  Outpatient Medications Prior to Visit  Medication Sig Dispense Refill   Cholecalciferol 50 MCG (2000 UT) TABS Take 2 tablets (4,000 Units total) by mouth daily. 180 tablet 1   indapamide (LOZOL) 1.25 MG tablet Take 1 tablet (1.25 mg total) by mouth daily. 90 tablet 1   nebivolol (BYSTOLIC) 10 MG tablet Take 1 tablet (10 mg total) by mouth daily. 90 tablet 1   potassium chloride (KLOR-CON) 10 MEQ tablet Take 1 tablet (10 mEq total) by mouth 2 (two) times daily. 180 tablet 1   No facility-administered medications prior to visit.    ROS Review of Systems  Constitutional:  Negative for chills, diaphoresis, fatigue and fever.  HENT: Negative.    Eyes: Negative.   Respiratory:  Negative for cough, chest tightness, shortness of breath and wheezing.   Cardiovascular:  Negative for chest pain, palpitations and leg swelling.  Gastrointestinal:  Negative for abdominal pain, diarrhea, nausea and vomiting.  Endocrine: Negative.   Genitourinary: Negative.  Negative for difficulty urinating, dysuria and hematuria.  Musculoskeletal: Negative.   Skin: Negative.   Neurological: Negative.  Negative for dizziness, weakness and light-headedness.  Hematological:  Negative for adenopathy. Does not bruise/bleed easily.  Psychiatric/Behavioral: Negative.      Objective:  BP 138/84 (BP Location: Left Arm, Patient Position: Sitting, Cuff Size: Large)   Pulse 80   Temp 98.8 F (37.1 C) (Oral)   Resp 16   Ht 5\' 2"  (1.575 m)   Wt 139 lb (63 kg)   SpO2 95%   BMI 25.42 kg/m   BP Readings from Last 3 Encounters:  02/18/22 138/84  08/16/21 140/86  04/25/21 (!) 144/96    Wt Readings from Last 3  Encounters:  02/18/22 139 lb (63 kg)  08/16/21 139 lb (63 kg)  04/25/21 140 lb (63.5 kg)    Physical Exam  Lab Results  Component Value Date   WBC 6.7 02/18/2022   HGB 12.5 02/18/2022   HCT 37.6 02/18/2022   PLT 232.0 02/18/2022   GLUCOSE 89 02/18/2022   CHOL 176 08/16/2021   TRIG 151.0 (H) 08/16/2021   HDL 49.70 08/16/2021   LDLCALC 96 08/16/2021   ALT 11 02/18/2022   AST 13 02/18/2022   NA 139 02/18/2022   K 3.5 02/18/2022   CL 100 02/18/2022   CREATININE 0.95 02/18/2022   BUN 18 02/18/2022   CO2 30 02/18/2022   TSH 1.51 04/25/2021    MM DIGITAL SCREENING BILATERAL  Result Date: 12/08/2019 CLINICAL DATA:  Screening. EXAM: DIGITAL SCREENING BILATERAL MAMMOGRAM WITH CAD COMPARISON:  Previous exam(s). ACR Breast Density Category c: The breast tissue is heterogeneously dense, which may obscure small masses. FINDINGS: There are no findings suspicious for malignancy. Images were processed with CAD. IMPRESSION: No mammographic evidence of malignancy. A result letter of this screening mammogram will be mailed directly to the patient. RECOMMENDATION: Screening mammogram in one year. (Code:SM-B-01Y) BI-RADS CATEGORY  1: Negative. Electronically Signed   By: 02/07/2020 M.D.   On: 12/08/2019 09:31    Assessment & Plan:   Tracy Walton was seen today for hypertension.  Diagnoses and all orders for this visit:  Essential hypertension- Her blood pressure is not  at the goal of 130/80.  Will continue the combination of nebivolol and indapamide.  Will treat the hypokalemia.  She will continue working on her lifestyle modifications. -     Basic metabolic panel; Future -     CBC with Differential/Platelet; Future -     Hepatic function panel; Future -     Hepatic function panel -     CBC with Differential/Platelet -     Basic metabolic panel -     potassium chloride (KLOR-CON) 10 MEQ tablet; Take 1 tablet (10 mEq total) by mouth 2 (two) times daily. -     nebivolol (BYSTOLIC) 10 MG  tablet; Take 1 tablet (10 mg total) by mouth daily. -     indapamide (LOZOL) 1.25 MG tablet; Take 1 tablet (1.25 mg total) by mouth daily.  Diuretic-induced hypokalemia -     Basic metabolic panel; Future -     Basic metabolic panel -     potassium chloride (KLOR-CON) 10 MEQ tablet; Take 1 tablet (10 mEq total) by mouth 2 (two) times daily.  Visit for screening mammogram -     MM DIGITAL SCREENING BILATERAL; Future  Screening for cervical cancer -     Ambulatory referral to Gynecology  Other orders -     Flu Vaccine QUAD 6+ mos PF IM (Fluarix Quad PF)   I am having Tracy Walton maintain her Cholecalciferol, potassium chloride, nebivolol, and indapamide.  Meds ordered this encounter  Medications   potassium chloride (KLOR-CON) 10 MEQ tablet    Sig: Take 1 tablet (10 mEq total) by mouth 2 (two) times daily.    Dispense:  180 tablet    Refill:  1   nebivolol (BYSTOLIC) 10 MG tablet    Sig: Take 1 tablet (10 mg total) by mouth daily.    Dispense:  90 tablet    Refill:  1   indapamide (LOZOL) 1.25 MG tablet    Sig: Take 1 tablet (1.25 mg total) by mouth daily.    Dispense:  90 tablet    Refill:  1     Follow-up: Return in about 6 months (around 08/20/2022).  Sanda Linger, MD

## 2022-03-15 ENCOUNTER — Other Ambulatory Visit: Payer: Self-pay | Admitting: Internal Medicine

## 2022-03-15 ENCOUNTER — Ambulatory Visit
Admission: RE | Admit: 2022-03-15 | Discharge: 2022-03-15 | Disposition: A | Discharge status: Home / Self Care | Payer: 59 | Source: Ambulatory Visit | Attending: Internal Medicine | Admitting: Internal Medicine

## 2022-03-15 DIAGNOSIS — E876 Hypokalemia: Secondary | ICD-10-CM

## 2022-03-15 DIAGNOSIS — I10 Essential (primary) hypertension: Secondary | ICD-10-CM

## 2022-03-15 DIAGNOSIS — Z124 Encounter for screening for malignant neoplasm of cervix: Secondary | ICD-10-CM

## 2022-03-15 DIAGNOSIS — Z1231 Encounter for screening mammogram for malignant neoplasm of breast: Secondary | ICD-10-CM

## 2022-04-15 ENCOUNTER — Encounter: Payer: Self-pay | Admitting: Family Medicine

## 2022-04-15 ENCOUNTER — Ambulatory Visit: Payer: 59 | Admitting: Family Medicine

## 2022-04-15 VITALS — BP 136/78 | HR 88 | Temp 101.5°F | Resp 20 | Ht 62.0 in | Wt 131.0 lb

## 2022-04-15 DIAGNOSIS — R6889 Other general symptoms and signs: Secondary | ICD-10-CM

## 2022-04-15 DIAGNOSIS — R0602 Shortness of breath: Secondary | ICD-10-CM

## 2022-04-15 DIAGNOSIS — J069 Acute upper respiratory infection, unspecified: Secondary | ICD-10-CM

## 2022-04-15 DIAGNOSIS — H60332 Swimmer's ear, left ear: Secondary | ICD-10-CM

## 2022-04-15 DIAGNOSIS — B9689 Other specified bacterial agents as the cause of diseases classified elsewhere: Secondary | ICD-10-CM

## 2022-04-15 LAB — POCT INFLUENZA A/B
Influenza A, POC: NEGATIVE
Influenza B, POC: NEGATIVE

## 2022-04-15 LAB — POC COVID19 BINAXNOW: SARS Coronavirus 2 Ag: NEGATIVE

## 2022-04-15 MED ORDER — ALBUTEROL SULFATE HFA 108 (90 BASE) MCG/ACT IN AERS: 2.0000 | INHALATION_SPRAY | Freq: Four times a day (QID) | RESPIRATORY_TRACT | 0 refills | Status: DC | PRN

## 2022-04-15 MED ORDER — CIPROFLOXACIN-DEXAMETHASONE 0.3-0.1 % OT SUSP: 4.0000 [drp] | Freq: Two times a day (BID) | OTIC | 0 refills | Status: DC

## 2022-04-15 NOTE — Patient Instructions (Signed)
Throat lozenges, chloraseptic spray, warm salt water gargles, hot tea/honey, cough syrup (Delsym), Tylenol/Ibuprofen, Vicks, and a humidifier at night.

## 2022-04-15 NOTE — Progress Notes (Signed)
Assessment & Plan:  1. Bacterial upper respiratory infection Continue antibiotics as prescribed by urgent care.  Discussed symptom management including throat lozenges, chloraseptic spray, warm salt water gargles, hot tea/honey, cough syrup (Delsym), Tylenol/Ibuprofen, Vicks, and a humidifier at night.  Education provided on URIs.  Note provided for patient to remain out of work until her voice returns as she is unable to work without it.  2. Acute swimmer's ear of left side - ciprofloxacin-dexamethasone (CIPRODEX) OTIC suspension; Place 4 drops into the left ear 2 (two) times daily for 7 days.  Dispense: 2.8 mL; Refill: 0  3. Shortness of breath Discussed appropriate use of albuterol inhaler. - albuterol (VENTOLIN HFA) 108 (90 Base) MCG/ACT inhaler; Inhale 2 puffs into the lungs every 6 (six) hours as needed for wheezing or shortness of breath.  Dispense: 18 g; Refill: 0  4. Flu-like symptoms - POC COVID-19 BinaxNow - POCT Influenza A/B Results for orders placed or performed in visit on 04/15/22  POC COVID-19 BinaxNow  Result Value Ref Range   SARS Coronavirus 2 Ag Negative Negative  POCT Influenza A/B  Result Value Ref Range   Influenza A, POC Negative Negative   Influenza B, POC Negative Negative     Follow up plan: Return if symptoms worsen or fail to improve.  Hendricks Limes, MSN, APRN, FNP-C  Subjective:  HPI: Tracy Walton is a 57 y.o. female presenting on 04/15/2022 for Hoarse (/St, sob, earache(left ), some cough - started about 1 week ago, went to urgent care on Friday. Amox 500 started )  Patient is accompanied by her mother, who she is okay with being present. She complains of cough, sore throat, left ear pain, shortness of breath, and loss of voice.  She reports it is harder to swallow and to sleep.  Onset of symptoms was 1 week ago, gradually worsening since that time. She is drinking plenty of fluids. Evaluation to date: Previously seen on 04/12/2022 at urgent care  where she was diagnosed with acute bacterial pharyngitis and prescribed amoxicillin 1,000 mg twice daily x 10 days. Treatment to date: antibiotics. She does not smoke.    ROS: Negative unless specifically indicated above in HPI.   Relevant past medical history reviewed and updated as indicated.   Allergies and medications reviewed and updated.   Current Outpatient Medications:    amoxicillin (AMOXIL) 500 MG capsule, Take 1,000 mg by mouth daily., Disp: , Rfl:    Cholecalciferol 50 MCG (2000 UT) TABS, Take 2 tablets (4,000 Units total) by mouth daily., Disp: 180 tablet, Rfl: 1   indapamide (LOZOL) 1.25 MG tablet, Take 1 tablet (1.25 mg total) by mouth daily., Disp: 90 tablet, Rfl: 1   potassium chloride (KLOR-CON) 10 MEQ tablet, Take 1 tablet (10 mEq total) by mouth 2 (two) times daily., Disp: 180 tablet, Rfl: 1   nebivolol (BYSTOLIC) 10 MG tablet, Take 1 tablet (10 mg total) by mouth daily., Disp: 90 tablet, Rfl: 1  Allergies  Allergen Reactions   Lisinopril Swelling    Objective:   BP 136/78   Pulse 88   Temp (!) 101.5 F (38.6 C)   Resp 20   Ht 5' 2"$  (1.575 m)   Wt 131 lb (59.4 kg)   SpO2 94%   BMI 23.96 kg/m    Physical Exam Vitals reviewed.  Constitutional:      General: She is not in acute distress.    Appearance: Normal appearance. She is not ill-appearing, toxic-appearing or diaphoretic.  HENT:  Head: Normocephalic and atraumatic.     Right Ear: Tympanic membrane, ear canal and external ear normal. There is no impacted cerumen.     Left Ear: Tympanic membrane and external ear normal. There is no impacted cerumen.     Ears:     Comments: Left ear canal tender, erythematous, and irritated. Patient has no voice.    Nose: Nose normal. No congestion or rhinorrhea.     Right Sinus: No maxillary sinus tenderness or frontal sinus tenderness.     Left Sinus: No maxillary sinus tenderness or frontal sinus tenderness.     Mouth/Throat:     Mouth: Mucous membranes are  moist.     Pharynx: Oropharynx is clear. Posterior oropharyngeal erythema present. No oropharyngeal exudate.     Tonsils: No tonsillar exudate or tonsillar abscesses. 2+ on the right. 2+ on the left.  Eyes:     General: No scleral icterus.       Right eye: No discharge.        Left eye: No discharge.     Conjunctiva/sclera: Conjunctivae normal.  Cardiovascular:     Rate and Rhythm: Normal rate and regular rhythm.     Heart sounds: Normal heart sounds. No murmur heard.    No friction rub. No gallop.  Pulmonary:     Effort: Pulmonary effort is normal. No respiratory distress.     Breath sounds: Normal breath sounds. No stridor. No wheezing, rhonchi or rales.  Musculoskeletal:        General: Normal range of motion.     Cervical back: Normal range of motion.  Lymphadenopathy:     Cervical: No cervical adenopathy.  Skin:    General: Skin is warm and dry.     Capillary Refill: Capillary refill takes less than 2 seconds.  Neurological:     General: No focal deficit present.     Mental Status: She is alert and oriented to person, place, and time. Mental status is at baseline.  Psychiatric:        Mood and Affect: Mood normal.        Behavior: Behavior normal.        Thought Content: Thought content normal.        Judgment: Judgment normal.

## 2022-04-19 ENCOUNTER — Ambulatory Visit: Payer: 59 | Admitting: Family Medicine

## 2022-04-19 VITALS — BP 132/66 | HR 102 | Temp 99.6°F | Ht 62.0 in | Wt 128.2 lb

## 2022-04-19 DIAGNOSIS — J040 Acute laryngitis: Secondary | ICD-10-CM

## 2022-04-19 DIAGNOSIS — H109 Unspecified conjunctivitis: Secondary | ICD-10-CM | POA: Diagnosis not present

## 2022-04-19 DIAGNOSIS — H9202 Otalgia, left ear: Secondary | ICD-10-CM | POA: Diagnosis not present

## 2022-04-19 DIAGNOSIS — R051 Acute cough: Secondary | ICD-10-CM | POA: Diagnosis not present

## 2022-04-19 MED ORDER — POLYMYXIN B-TRIMETHOPRIM 10000-0.1 UNIT/ML-% OP SOLN: 2.0000 [drp] | OPHTHALMIC | 0 refills | Status: DC

## 2022-04-19 NOTE — Progress Notes (Unsigned)
Established Patient Office Visit  Subjective   Patient ID: Tracy Walton, female    DOB: 06/16/1965  Age: 57 y.o. MRN: [id number]  Chief Complaint  Patient presents with   Conjunctivitis    Patient complains of conjunctivitis in right eye, Tried Amoxicillin, Robitussin, Mucinex, Albuterol with little relief    Cough    Patient complains of cough, x1 week, Productive cough, Tried Amoxicillin, Robitussin, Mucinex, Albuterol with little relief    Ear Pain    Patient complains of left ear pain, x1 week, Tried Amoxicillin, Robitussin, Mucinex, Albuterol with little relief    Hoarse   Shortness of Breath    Patient complains of shortness of breath, x1 week     HPI  {History (Optional):23778} Tracy Walton is seen as a work and with several issues as below  She apparently had laryngitis symptoms for about the past week.  She actually went to urgent care 4 days ago and was placed on amoxicillin and Ciprodex eardrops left ear.  She has had some ongoing left ear pain but overall improving.  No drainage.  She has new symptom of redness and drainage from the right eye just today.  No contact use.  No blurred vision.  She has had occasional dry cough over the past week.  No reported appetite or weight changes.  No fever.  She had COVID and influenza testing on the 12th which were both negative.  Never smoked.  No past medical history on file. No past surgical history on file.  reports that she has never smoked. She has never used smokeless tobacco. She reports that she does not currently use alcohol. She reports that she does not use drugs. family history includes Hypertension in her father and mother. Allergies  Allergen Reactions   Lisinopril Swelling    Review of Systems  Constitutional:  Negative for chills and fever.  HENT:  Negative for ear discharge, sinus pain and sore throat.   Eyes:  Positive for discharge and redness. Negative for blurred vision and pain.  Respiratory:  Positive  for cough. Negative for sputum production, shortness of breath and wheezing.   Neurological:  Negative for headaches.      Objective:     BP 132/66 (BP Location: Left Arm, Patient Position: Sitting, Cuff Size: Normal)   Pulse (!) 102   Temp 99.6 F (37.6 C) (Oral)   Ht 5' 2"$  (1.575 m)   Wt 128 lb 3.2 oz (58.2 kg)   SpO2 96%   BMI 23.45 kg/m  {Vitals History (Optional):23777}  Physical Exam Vitals reviewed.  Constitutional:      General: She is not in acute distress. HENT:     Head:     Comments: Right TM is normal.  Left TM reveals some mild erythema along the superior portion of the malleus.  No perforation.  No other signs of inflammation in the canal. Eyes:     Comments: Right eye reveals erythematous conjunctive up.  She has some obvious purulent drainage around the right eye.  Left conjunctive appears normal  Cardiovascular:     Rate and Rhythm: Normal rate and regular rhythm.  Pulmonary:     Effort: Pulmonary effort is normal.     Breath sounds: Normal breath sounds.  Musculoskeletal:     Cervical back: Neck supple.  Lymphadenopathy:     Cervical: No cervical adenopathy.  Neurological:     Mental Status: She is alert.      No results found for any visits  on 04/19/22.  {Labs (Optional):23779}  The 10-year ASCVD risk score (Arnett DK, et al., 2019) is: 5.4%    Assessment & Plan:   #1 bacterial conjunctivitis right eye.  Recommend warm compresses several times daily.  Start Polytrim ophthalmic drops 2 drops right eye every 4 hours while awake  #2 laryngitis of 1 week duration.  Non-smoker.  We explained these are usually viral.  No active GERD symptoms.  Recommend voice rest and sip on warm liquids and follow-up with primary if not improving in 1 to 2 weeks  #3 dry cough.  Suspect viral.  Nonfocal exam.  No respiratory distress.  No reported fever.  Patient already on amoxicillin twice daily for urgent care visit few days ago  #4 left otalgia.  Mild  erythema on exam.  Patient already on amoxicillin and Ciprodex drops.  This appears to be improving  Carolann Littler, MD

## 2022-04-20 ENCOUNTER — Inpatient Hospital Stay (HOSPITAL_COMMUNITY)
Admission: EM | Admit: 2022-04-20 | Discharge: 2022-04-24 | DRG: 195 | Disposition: A | Discharge status: Home Health | Payer: 59 | Attending: Internal Medicine | Admitting: Internal Medicine

## 2022-04-20 ENCOUNTER — Encounter (HOSPITAL_COMMUNITY): Payer: Self-pay

## 2022-04-20 ENCOUNTER — Emergency Department (HOSPITAL_COMMUNITY): Payer: 59

## 2022-04-20 DIAGNOSIS — R4182 Altered mental status, unspecified: Secondary | ICD-10-CM | POA: Diagnosis present

## 2022-04-20 DIAGNOSIS — R3129 Other microscopic hematuria: Secondary | ICD-10-CM

## 2022-04-20 DIAGNOSIS — R1310 Dysphagia, unspecified: Secondary | ICD-10-CM | POA: Diagnosis present

## 2022-04-20 DIAGNOSIS — Z8249 Family history of ischemic heart disease and other diseases of the circulatory system: Secondary | ICD-10-CM

## 2022-04-20 DIAGNOSIS — J040 Acute laryngitis: Secondary | ICD-10-CM | POA: Diagnosis present

## 2022-04-20 DIAGNOSIS — D649 Anemia, unspecified: Secondary | ICD-10-CM

## 2022-04-20 DIAGNOSIS — Z602 Problems related to living alone: Secondary | ICD-10-CM | POA: Diagnosis present

## 2022-04-20 DIAGNOSIS — J918 Pleural effusion in other conditions classified elsewhere: Secondary | ICD-10-CM | POA: Diagnosis present

## 2022-04-20 DIAGNOSIS — A419 Sepsis, unspecified organism: Secondary | ICD-10-CM | POA: Diagnosis present

## 2022-04-20 DIAGNOSIS — Z888 Allergy status to other drugs, medicaments and biological substances status: Secondary | ICD-10-CM

## 2022-04-20 DIAGNOSIS — I10 Essential (primary) hypertension: Secondary | ICD-10-CM | POA: Diagnosis present

## 2022-04-20 DIAGNOSIS — J189 Pneumonia, unspecified organism: Principal | ICD-10-CM | POA: Diagnosis present

## 2022-04-20 DIAGNOSIS — J069 Acute upper respiratory infection, unspecified: Secondary | ICD-10-CM | POA: Diagnosis present

## 2022-04-20 DIAGNOSIS — Z1152 Encounter for screening for COVID-19: Secondary | ICD-10-CM

## 2022-04-20 DIAGNOSIS — G9340 Encephalopathy, unspecified: Secondary | ICD-10-CM

## 2022-04-20 DIAGNOSIS — H109 Unspecified conjunctivitis: Secondary | ICD-10-CM

## 2022-04-20 DIAGNOSIS — E876 Hypokalemia: Secondary | ICD-10-CM

## 2022-04-20 DIAGNOSIS — Z79899 Other long term (current) drug therapy: Secondary | ICD-10-CM

## 2022-04-20 LAB — URINALYSIS, ROUTINE W REFLEX MICROSCOPIC
Bacteria, UA: NONE SEEN
Bilirubin Urine: NEGATIVE
Glucose, UA: NEGATIVE mg/dL
Ketones, ur: 20 mg/dL — AB
Leukocytes,Ua: NEGATIVE
Nitrite: NEGATIVE
Protein, ur: 30 mg/dL — AB
Specific Gravity, Urine: 1.018 (ref 1.005–1.030)
pH: 6 (ref 5.0–8.0)

## 2022-04-20 LAB — CBG MONITORING, ED: Glucose-Capillary: 120 mg/dL — ABNORMAL HIGH (ref 70–99)

## 2022-04-20 LAB — RAPID URINE DRUG SCREEN, HOSP PERFORMED
Amphetamines: NOT DETECTED
Barbiturates: NOT DETECTED
Benzodiazepines: NOT DETECTED
Cocaine: NOT DETECTED
Opiates: NOT DETECTED
Tetrahydrocannabinol: NOT DETECTED

## 2022-04-20 LAB — IRON AND TIBC
Iron: 23 ug/dL — ABNORMAL LOW (ref 28–170)
Saturation Ratios: 10 % — ABNORMAL LOW (ref 10.4–31.8)
TIBC: 223 ug/dL — ABNORMAL LOW (ref 250–450)
UIBC: 200 ug/dL

## 2022-04-20 LAB — RESP PANEL BY RT-PCR (RSV, FLU A&B, COVID)  RVPGX2
Influenza A by PCR: NEGATIVE
Influenza B by PCR: NEGATIVE
Resp Syncytial Virus by PCR: NEGATIVE
SARS Coronavirus 2 by RT PCR: NEGATIVE

## 2022-04-20 LAB — TSH: TSH: 1.267 u[IU]/mL (ref 0.350–4.500)

## 2022-04-20 LAB — CBC WITH DIFFERENTIAL/PLATELET
Abs Immature Granulocytes: 0.23 10*3/uL — ABNORMAL HIGH (ref 0.00–0.07)
Basophils Absolute: 0.1 10*3/uL (ref 0.0–0.1)
Basophils Relative: 1 %
Eosinophils Absolute: 0.1 10*3/uL (ref 0.0–0.5)
Eosinophils Relative: 1 %
HCT: 32.7 % — ABNORMAL LOW (ref 36.0–46.0)
Hemoglobin: 10.7 g/dL — ABNORMAL LOW (ref 12.0–15.0)
Immature Granulocytes: 2 %
Lymphocytes Relative: 12 %
Lymphs Abs: 1.7 10*3/uL (ref 0.7–4.0)
MCH: 28.4 pg (ref 26.0–34.0)
MCHC: 32.7 g/dL (ref 30.0–36.0)
MCV: 86.7 fL (ref 80.0–100.0)
Monocytes Absolute: 1.3 10*3/uL — ABNORMAL HIGH (ref 0.1–1.0)
Monocytes Relative: 9 %
Neutro Abs: 10.8 10*3/uL — ABNORMAL HIGH (ref 1.7–7.7)
Neutrophils Relative %: 75 %
Platelets: 341 10*3/uL (ref 150–400)
RBC: 3.77 MIL/uL — ABNORMAL LOW (ref 3.87–5.11)
RDW: 15.3 % (ref 11.5–15.5)
WBC: 14.2 10*3/uL — ABNORMAL HIGH (ref 4.0–10.5)
nRBC: 0 % (ref 0.0–0.2)

## 2022-04-20 LAB — VITAMIN B12: Vitamin B-12: 689 pg/mL (ref 180–914)

## 2022-04-20 LAB — COMPREHENSIVE METABOLIC PANEL
ALT: 53 U/L — ABNORMAL HIGH (ref 0–44)
AST: 37 U/L (ref 15–41)
Albumin: 3.3 g/dL — ABNORMAL LOW (ref 3.5–5.0)
Alkaline Phosphatase: 60 U/L (ref 38–126)
Anion gap: 14 (ref 5–15)
BUN: 14 mg/dL (ref 6–20)
CO2: 31 mmol/L (ref 22–32)
Calcium: 9.1 mg/dL (ref 8.9–10.3)
Chloride: 88 mmol/L — ABNORMAL LOW (ref 98–111)
Creatinine, Ser: 0.9 mg/dL (ref 0.44–1.00)
GFR, Estimated: >60 mL/min (ref >60)
Glucose, Bld: 110 mg/dL — ABNORMAL HIGH (ref 70–99)
Potassium: 2.6 mmol/L — CL (ref 3.5–5.1)
Sodium: 133 mmol/L — ABNORMAL LOW (ref 135–145)
Total Bilirubin: 0.8 mg/dL (ref 0.3–1.2)
Total Protein: 7.9 g/dL (ref 6.5–8.1)

## 2022-04-20 LAB — FOLATE: Folate: 14.4 ng/mL (ref >5.9)

## 2022-04-20 LAB — FERRITIN: Ferritin: 334 ng/mL — ABNORMAL HIGH (ref 11–307)

## 2022-04-20 LAB — MAGNESIUM: Magnesium: 2.2 mg/dL (ref 1.7–2.4)

## 2022-04-20 LAB — AMMONIA: Ammonia: 11 umol/L (ref 9–35)

## 2022-04-20 LAB — LACTIC ACID, PLASMA: Lactic Acid, Venous: 1.4 mmol/L (ref 0.5–1.9)

## 2022-04-20 MED ORDER — SODIUM CHLORIDE 0.9 % IV BOLUS
1000.0000 mL | Freq: Once | INTRAVENOUS | Status: AC
  Administered 2022-04-20: 1000 mL via INTRAVENOUS

## 2022-04-20 MED ORDER — PANTOPRAZOLE SODIUM 40 MG IV SOLR
40.0000 mg | Freq: Once | INTRAVENOUS | Status: AC
  Administered 2022-04-20: 40 mg via INTRAVENOUS
  Filled 2022-04-20: qty 10

## 2022-04-20 MED ORDER — SODIUM CHLORIDE 0.9 % IV SOLN
500.0000 mg | Freq: Every evening | INTRAVENOUS | Status: DC
  Administered 2022-04-20: 500 mg via INTRAVENOUS
  Filled 2022-04-20: qty 5

## 2022-04-20 MED ORDER — ACETAMINOPHEN 325 MG PO TABS: 650.0000 mg | ORAL_TABLET | Freq: Four times a day (QID) | ORAL | Status: DC | PRN

## 2022-04-20 MED ORDER — ENOXAPARIN SODIUM 40 MG/0.4ML IJ SOSY
40.0000 mg | PREFILLED_SYRINGE | INTRAMUSCULAR | Status: DC
  Administered 2022-04-20 – 2022-04-23 (×3): 40 mg via SUBCUTANEOUS
  Filled 2022-04-20 (×4): qty 0.4

## 2022-04-20 MED ORDER — SODIUM CHLORIDE 0.9 % IV SOLN: INTRAVENOUS | Status: DC

## 2022-04-20 MED ORDER — TRAZODONE HCL 50 MG PO TABS: 25.0000 mg | ORAL_TABLET | Freq: Every evening | ORAL | Status: DC | PRN

## 2022-04-20 MED ORDER — IPRATROPIUM-ALBUTEROL 0.5-2.5 (3) MG/3ML IN SOLN
3.0000 mL | Freq: Four times a day (QID) | RESPIRATORY_TRACT | Status: DC
  Administered 2022-04-20: 3 mL via RESPIRATORY_TRACT
  Filled 2022-04-20: qty 3

## 2022-04-20 MED ORDER — NEBIVOLOL HCL 10 MG PO TABS
10.0000 mg | ORAL_TABLET | Freq: Every day | ORAL | Status: DC
  Administered 2022-04-21 – 2022-04-24 (×4): 10 mg via ORAL
  Filled 2022-04-20 (×4): qty 1

## 2022-04-20 MED ORDER — POTASSIUM CHLORIDE CRYS ER 20 MEQ PO TBCR
40.0000 meq | EXTENDED_RELEASE_TABLET | Freq: Once | ORAL | Status: AC
  Administered 2022-04-20: 40 meq via ORAL
  Filled 2022-04-20: qty 2

## 2022-04-20 MED ORDER — POTASSIUM CHLORIDE 10 MEQ/100ML IV SOLN
10.0000 meq | Freq: Once | INTRAVENOUS | Status: AC
  Administered 2022-04-21: 10 meq via INTRAVENOUS
  Filled 2022-04-20: qty 100

## 2022-04-20 MED ORDER — ORAL CARE MOUTH RINSE: 15.0000 mL | OROMUCOSAL | Status: DC | PRN

## 2022-04-20 MED ORDER — POTASSIUM CHLORIDE 10 MEQ/100ML IV SOLN
10.0000 meq | INTRAVENOUS | Status: AC
  Administered 2022-04-20 (×3): 10 meq via INTRAVENOUS
  Filled 2022-04-20 (×3): qty 100

## 2022-04-20 MED ORDER — ONDANSETRON HCL 4 MG/2ML IJ SOLN: 4.0000 mg | Freq: Four times a day (QID) | INTRAMUSCULAR | Status: DC | PRN

## 2022-04-20 MED ORDER — POLYMYXIN B-TRIMETHOPRIM 10000-0.1 UNIT/ML-% OP SOLN
2.0000 [drp] | OPHTHALMIC | Status: DC
  Administered 2022-04-21 – 2022-04-24 (×17): 2 [drp] via OPHTHALMIC
  Filled 2022-04-20: qty 10

## 2022-04-20 MED ORDER — METOPROLOL TARTRATE 5 MG/5ML IV SOLN: 5.0000 mg | Freq: Four times a day (QID) | INTRAVENOUS | Status: DC | PRN

## 2022-04-20 MED ORDER — ACETAMINOPHEN 650 MG RE SUPP: 650.0000 mg | Freq: Four times a day (QID) | RECTAL | Status: DC | PRN

## 2022-04-20 MED ORDER — INDAPAMIDE 1.25 MG PO TABS
1.2500 mg | ORAL_TABLET | Freq: Every day | ORAL | Status: DC
  Administered 2022-04-21 – 2022-04-24 (×4): 1.25 mg via ORAL
  Filled 2022-04-20 (×4): qty 1

## 2022-04-20 MED ORDER — SODIUM CHLORIDE 0.9 % IV SOLN
1.0000 g | Freq: Once | INTRAVENOUS | Status: AC
  Administered 2022-04-20: 1 g via INTRAVENOUS
  Filled 2022-04-20: qty 10

## 2022-04-20 MED ORDER — GUAIFENESIN-DM 100-10 MG/5ML PO SYRP: 5.0000 mL | ORAL_SOLUTION | ORAL | Status: DC | PRN

## 2022-04-20 MED ORDER — ALBUTEROL SULFATE (2.5 MG/3ML) 0.083% IN NEBU: 2.5000 mg | INHALATION_SOLUTION | Freq: Four times a day (QID) | RESPIRATORY_TRACT | Status: DC | PRN

## 2022-04-20 MED ORDER — POTASSIUM CHLORIDE ER 10 MEQ PO TBCR
10.0000 meq | EXTENDED_RELEASE_TABLET | Freq: Two times a day (BID) | ORAL | Status: DC
  Administered 2022-04-21 (×2): 10 meq via ORAL
  Filled 2022-04-20 (×5): qty 1

## 2022-04-20 MED ORDER — ONDANSETRON HCL 4 MG/2ML IJ SOLN
4.0000 mg | Freq: Once | INTRAMUSCULAR | Status: AC
  Administered 2022-04-20: 4 mg via INTRAVENOUS
  Filled 2022-04-20: qty 2

## 2022-04-20 MED ORDER — ONDANSETRON HCL 4 MG PO TABS: 4.0000 mg | ORAL_TABLET | Freq: Four times a day (QID) | ORAL | Status: DC | PRN

## 2022-04-20 NOTE — Assessment & Plan Note (Addendum)
57 year old female presenting with one week of upper respiratory symptoms of productive cough, fever, shortness of breath with altered mental status x 2 days found to be septic from LLL pneumonia  -obs to telemetry  -Infiltrate on CXR and 2-3 characteristics (fever, leukocytosis, purulent sputum) are consistent with pneumonia. -likely CAP, rocephin given in ED, start azithromycin -check legionella and strep pneumonia urinary antigens  -BC pending -lactic acid wnl, PCT baseline>trend -check covid/flu/rsv and RVP -sputum culture -robitussin DM -IS to bedside -duoneb x 2  -SABA PRN -gentle IVF

## 2022-04-20 NOTE — ED Provider Triage Note (Signed)
Emergency Medicine Provider Triage Evaluation Note  Tracy Walton , a 57 y.o. female  was evaluated in triage.  Pt complains of cough and missing a few doses of antibiotics.  Patient was sent via EMS from urgent care.  Urgent care was concerned because the patient seemed to be acting somewhat altered.  They were concerned for possible sepsis.  Patient does not meet SIRS criteria at time of arrival.  Patient's complaints include not being hungry/able to eat for the past few days.  Increased phlegm causing difficulty with swallowing.  Missing multiple doses of medication over the past 2 days due to difficulty swallowing.  Patient does have a cough and was diagnosed with upper respiratory infection 5 days ago.  Review of Systems  Positive: As above Negative: As above  Physical Exam  BP (!) 158/89 (BP Location: Left Arm)   Pulse (!) 108   Temp 98.2 F (36.8 C) (Oral)   Resp 20   SpO2 98%  Gen:   Awake, no distress   Resp:  Normal effort  MSK:   Moves extremities without difficulty  Other:  Patient alert and oriented, answering appropriately. Patient seems to have a difficult time voicing complaints  Medical Decision Making  Medically screening exam initiated at 3:50 PM.  Appropriate orders placed.  Tracy Walton was informed that the remainder of the evaluation will be completed by another provider, this initial triage assessment does not replace that evaluation, and the importance of remaining in the ED until their evaluation is complete.     Dorothyann Peng, PA-C 04/20/22 1552

## 2022-04-20 NOTE — Assessment & Plan Note (Addendum)
Magnesium wnl Repleted in ED Continue home replacement: 58mq BID  Trend and replete as needed

## 2022-04-20 NOTE — Assessment & Plan Note (Signed)
Elevated, but has not taken her home medication Continue lozol 0000000 daily and bystolic [id number] daily  PRN metoprolol IV, parameters written

## 2022-04-20 NOTE — ED Triage Notes (Signed)
Pt arrived via EMS, sent by UC for concern for sepsis and AMS. Pt able to answer questions appropriately, but some sentences not making sense. Pt seems fixated on particular situations not brought up during triage. Denies feeling that she is having trouble finding her words.

## 2022-04-20 NOTE — Assessment & Plan Note (Signed)
Moderate hgb with 11-20 RBC Repeat UA tomorrow Check culture  If + start hematuria w/u

## 2022-04-20 NOTE — Assessment & Plan Note (Signed)
No hx of anemia and past cbc wnl Check iron studies Trend

## 2022-04-20 NOTE — ED Provider Notes (Signed)
[place] EMERGENCY DEPARTMENT AT [facility] Provider Note   CSN: [id number] Arrival date & time: 04/20/22  1527     History No chief complaint on file.   Tracy Walton is a 57 y.o. female.  Patient presents emergency department for altered mental status.  Patient was seen at urgent care earlier today was advised to come to the emergency department as she appeared to be altered.  Patient was brought in by EMS. No recent hospitalizations. Denies history of anxiety, depression, bipolar disorder, schizophrenia. Patient denies SI, HI, auditory/visual hallucinations, or self-harm. Patient appears to struggle describing her complaints and seems anxious, unsure if this is patient's baseline.  HPI     Home Medications Prior to Admission medications   Medication Sig Start Date End Date Taking? Authorizing Provider  albuterol (VENTOLIN HFA) 108 (90 Base) MCG/ACT inhaler Inhale 2 puffs into the lungs every 6 (six) hours as needed for wheezing or shortness of breath. 04/15/22   Loman Brooklyn, FNP  amoxicillin (AMOXIL) 500 MG capsule Take 1,000 mg by mouth daily. 04/12/22 04/22/22  [provider]  Cholecalciferol 50 MCG (2000 UT) TABS Take 2 tablets (4,000 Units total) by mouth daily. 06/07/20   Janith Lima, MD  ciprofloxacin-dexamethasone (CIPRODEX) OTIC suspension Place 4 drops into the left ear 2 (two) times daily for 7 days. 04/15/22 04/22/22  Loman Brooklyn, FNP  indapamide (LOZOL) 1.25 MG tablet Take 1 tablet (1.25 mg total) by mouth daily. 02/18/22   Janith Lima, MD  nebivolol (BYSTOLIC) 10 MG tablet Take 1 tablet (10 mg total) by mouth daily. 02/18/22   Janith Lima, MD  potassium chloride (KLOR-CON) 10 MEQ tablet Take 1 tablet (10 mEq total) by mouth 2 (two) times daily. 02/18/22   Janith Lima, MD  trimethoprim-polymyxin b (POLYTRIM) ophthalmic solution Place 2 drops into the right eye every 4 (four) hours. 04/19/22   Burchette, Alinda Sierras, MD      Allergies     Lisinopril    Review of Systems   Review of Systems  Physical Exam Updated Vital Signs BP (!) 142/84   Pulse 97   Temp 98.2 F (36.8 C) (Oral)   Resp 13   SpO2 100%  Physical Exam  ED Results / Procedures / Treatments   Labs (all labs ordered are listed, but only abnormal results are displayed) Labs Reviewed  CBC WITH DIFFERENTIAL/PLATELET - Abnormal; Notable for the following components:      Result Value   WBC 14.2 (*)    RBC 3.77 (*)    Hemoglobin 10.7 (*)    HCT 32.7 (*)    Neutro Abs 10.8 (*)    Monocytes Absolute 1.3 (*)    Abs Immature Granulocytes 0.23 (*)    All other components within normal limits  COMPREHENSIVE METABOLIC PANEL - Abnormal; Notable for the following components:   Sodium 133 (*)    Potassium 2.6 (*)    Chloride 88 (*)    Glucose, Bld 110 (*)    Albumin 3.3 (*)    ALT 53 (*)    All other components within normal limits  URINALYSIS, ROUTINE W REFLEX MICROSCOPIC - Abnormal; Notable for the following components:   Hgb urine dipstick MODERATE (*)    Ketones, ur 20 (*)    Protein, ur 30 (*)    All other components within normal limits  CBG MONITORING, ED - Abnormal; Notable for the following components:   Glucose-Capillary 120 (*)    All  other components within normal limits  CULTURE, BLOOD (ROUTINE X 2)  CULTURE, BLOOD (ROUTINE X 2)  EXPECTORATED SPUTUM ASSESSMENT W GRAM STAIN, RFLX TO RESP C  LACTIC ACID, PLASMA  MAGNESIUM  RAPID URINE DRUG SCREEN, HOSP PERFORMED  AMMONIA  TSH  PROCALCITONIN  PROCALCITONIN  IRON AND TIBC  FERRITIN  VITAMIN B12  FOLATE    EKG EKG Interpretation  Date/Time:  Saturday April 20 2022 18:01:40 EST Ventricular Rate:  102 PR Interval:  122 QRS Duration: 86 QT Interval:  356 QTC Calculation: 464 R Axis:   55 Text Interpretation: Sinus tachycardia Ventricular premature complex Aberrant complex Probable left atrial enlargement Minimal ST depression, lateral leads Confirmed by Regan Lemming ([id number])  on 04/20/2022 6:28:12 PM  Radiology CT Head Wo Contrast  Result Date: 04/20/2022 CLINICAL DATA:  57 year old female with history of altered mental status. EXAM: CT HEAD WITHOUT CONTRAST TECHNIQUE: Contiguous axial images were obtained from the base of the skull through the vertex without intravenous contrast. RADIATION DOSE REDUCTION: This exam was performed according to the departmental dose-optimization program which includes automated exposure control, adjustment of the mA and/or kV according to patient size and/or use of iterative reconstruction technique. COMPARISON:  No priors. FINDINGS: Brain: No evidence of acute infarction, hemorrhage, hydrocephalus, extra-axial collection or mass lesion/mass effect. Vascular: No hyperdense vessel or unexpected calcification. Skull: Normal. Negative for fracture or focal lesion. Sinuses/Orbits: No acute finding. Other: None. IMPRESSION: 1. No acute intracranial abnormalities. The appearance of the brain is normal. Electronically Signed   By: Vinnie Langton M.D.   On: 04/20/2022 16:44   DG Chest 2 View  Result Date: 04/20/2022 CLINICAL DATA:  Cough, altered level of consciousness, possible sepsis EXAM: CHEST - 2 VIEW COMPARISON:  None Available. FINDINGS: Frontal and lateral views of the chest demonstrate an unremarkable cardiac silhouette. Dense left lower lobe airspace disease consistent with pneumonia. Trace left parapneumonic effusion. Right chest is clear. No pneumothorax. IMPRESSION: 1. Dense left lower lobe pneumonia, with trace left parapneumonic effusion. Electronically Signed   By: Randa Ngo M.D.   On: 04/20/2022 16:22    Procedures .Critical Care  Performed by: Luvenia Heller, PA-C Authorized by: Luvenia Heller, PA-C   Critical care provider statement:    Critical care time (minutes):  30   Critical care start time:  04/20/2022 5:45 PM   Critical care end time:  04/20/2022 6:15 PM   Critical care time was exclusive of:  Separately  billable procedures and treating other patients   Critical care was necessary to treat or prevent imminent or life-threatening deterioration of the following conditions:  Cardiac failure   Critical care was time spent personally by me on the following activities:  Development of treatment plan with patient or surrogate, discussions with consultants, evaluation of patient's response to treatment, examination of patient, ordering and review of laboratory studies, ordering and review of radiographic studies, ordering and performing treatments and interventions, pulse oximetry, re-evaluation of patient's condition and review of old charts   I assumed direction of critical care for this patient from another provider in my specialty: no      Medications Ordered in ED Medications  potassium chloride 10 mEq in 100 mL IVPB (10 mEq Intravenous New Bag/Given 04/20/22 1809)  azithromycin (ZITHROMAX) 500 mg in sodium chloride 0.9 % 250 mL IVPB (has no administration in time range)  ondansetron (ZOFRAN) injection 4 mg (has no administration in time range)  sodium chloride 0.9 % bolus 1,000 mL (1,000 mLs Intravenous  New Bag/Given 04/20/22 1749)  potassium chloride SA (KLOR-CON M) CR tablet 40 mEq (40 mEq Oral Given 04/20/22 1750)  cefTRIAXone (ROCEPHIN) 1 g in sodium chloride 0.9 % 100 mL IVPB (0 g Intravenous Stopped 04/20/22 1859)    ED Course/ Medical Decision Making/ A&P                           Medical Decision Making Amount and/or Complexity of Data Reviewed Labs: ordered.  Risk Prescription drug management. Decision regarding hospitalization.   This patient presents to the ED for concern of altered mental status.  Differential diagnosis includes psychosis, sepsis, substance use disorder, alcohol intoxication, meningitis   Lab Tests:  I Ordered, and personally interpreted labs.  The pertinent results include:  Evidence of dehydration with hyponatremia at 133, hypochloremia at 88, elevated white  count at 14.2, all other labs largely normal or pending at this time   Imaging Studies ordered:  I ordered imaging studies including chest xray  I independently visualized and interpreted imaging which showed left lower lobe pneumonia I agree with the radiologist interpretation   Medicines ordered and prescription drug management:  I ordered medication including fluids, potassium, rocephin for dehydration, hypokalemia, pneumonia  Reevaluation of the patient after these medicines showed that the patient improved I have reviewed the patients home medicines and have made adjustments as needed   Problem List / ED Course:  Patient presented to the ED via EMS for AMS. Patient was seen at urgent care prior to arriving at ED and provider at [facility] was concerned for possible AMS in setting of sepsis. On arrival, patient did not meet SIRs criteria so code sepsis was not called. Patient now meeting SIRs criteria by tachycardia and leukocytosis at 14.2. Given patient presentation, plan will be to consult hospitalist for admission as patient does appear somewhat altered without acute cause identified at this time. Consulted with Dr. Rogers Blocker regarding inpatient admission for patient due to AMS and meeting sepsis criteria. Given patient presentation without clear cause of AMS and patient likely being unreliable to discharge home safely and take antibiotics as prescribed, patient would likely benefit from inpatient admission for better antibiotic coverage. Dr. Rogers Blocker is agreeable that patient should be admitted.  Final Clinical Impression(s) / ED Diagnoses Final diagnoses:  Sepsis without acute organ dysfunction, due to unspecified organism [facility])    Rx / DC Orders ED Discharge Orders     None         Vladimir Creeks 04/20/22 1921    Regan Lemming, MD 04/20/22 2016

## 2022-04-20 NOTE — Assessment & Plan Note (Addendum)
Likely secondary to sepsis from pneumonia Head CT with no acute finding-no neuro deficits on exam. A&O x 3. Treat pneumonia, BC pending UDS negative  Check metabolic labs  Will do swallow screen, but alert and oriented with good laryngeal elevation.  If no improvement in encephalopathy as sepsis resolves consider MRI and psychiatry consult

## 2022-04-20 NOTE — Assessment & Plan Note (Signed)
Does not wear contact lens Started on polytrim, will continue

## 2022-04-20 NOTE — H&P (Signed)
History and Physical    Patient: Tracy Walton [id number] DOB: Nov 01, 1965 DOA: 04/20/2022 DOS: the patient was seen and examined on 04/20/2022 PCP: Janith Lima, MD  [facility] from:  [facility]  - lives alone.    Chief Complaint: AMS/URI   HPI: Tracy Walton is a 57 y.o. female with medical history significant of HTN who presented to ED after being seen at [facility] and found to be confused with concern for possible sepsis with recent viral infection vs. Electrolyte issue with her excessive water drinking. Seen at [facility] on 2/9 and started on amoxicillin and given ciprodex drops.  Her mom is the room and helps with history.  On 2/9 she started to have shortness of breath, a productive cough and sore throat, earache and difficulties swallowing. Her mom reports fever to 101 on 2/12. She denies any sick contacts.  She works for the [employer], but mostly works from home. No international travel.   On 2/16 her mom reports she started to have confusion, but thinks this may have started prior to this.  She started to change her clothes often during the day and wear hats in the house. She would play with drinking cups and open several water bottles at the same time. This morning she opened a can of pears and peas and acts like she can not swallow anything.  On 2/17 she found money in envelopes in her trash can, unpaid bills and envelopes stamped and sealed not in mail.   She denies any AH/VH, mom confirms this.    Denies any vision changes/headaches, chest pain or palpitations, abdominal pain, N/V/D, dysuria or leg swelling. She also states she is very tired and hasn't really slept well in the past week. Drinking okay, may have missed a meal or two    She does not smoke or drink alcohol.   ER Course:  vitals: afebrile, bp: 158/89, HR: 108, RR: 20, oxygen: 98%RA Pertinent labs: wbc: 14.2, hgb: 10.7 , sodium: 133, potassium :2.6,  CXR: dense left lower lobe pneumonia. Trace left parapneumonic effusion CT brain:  no acute finding In ED: BC taken, 1L IVF, potassium repleted and given rocephin. [facility] asked to admit.    Review of Systems: As mentioned in the history of present illness. All other systems reviewed and are negative. History reviewed. No pertinent past medical history. History reviewed. No pertinent surgical history. Social History:  reports that she has never smoked. She has never used smokeless tobacco. She reports that she does not currently use alcohol. She reports that she does not use drugs.  Allergies  Allergen Reactions   Lisinopril Swelling    Family History  Problem Relation Age of Onset   Hypertension Mother    Hypertension Father    Breast cancer Neg Hx     Prior to Admission medications   Medication Sig Start Date End Date Taking? Authorizing Provider  albuterol (VENTOLIN HFA) 108 (90 Base) MCG/ACT inhaler Inhale 2 puffs into the lungs every 6 (six) hours as needed for wheezing or shortness of breath. 04/15/22   Loman Brooklyn, FNP  amoxicillin (AMOXIL) 500 MG capsule Take 1,000 mg by mouth daily. 04/12/22 04/22/22  [provider]  Cholecalciferol 50 MCG (2000 UT) TABS Take 2 tablets (4,000 Units total) by mouth daily. 06/07/20   Janith Lima, MD  ciprofloxacin-dexamethasone (CIPRODEX) OTIC suspension Place 4 drops into the left ear 2 (two) times daily for 7 days. 04/15/22 04/22/22  Loman Brooklyn, FNP  indapamide (LOZOL) 1.25  MG tablet Take 1 tablet (1.25 mg total) by mouth daily. 02/18/22   Janith Lima, MD  nebivolol (BYSTOLIC) 10 MG tablet Take 1 tablet (10 mg total) by mouth daily. 02/18/22   Janith Lima, MD  potassium chloride (KLOR-CON) 10 MEQ tablet Take 1 tablet (10 mEq total) by mouth 2 (two) times daily. 02/18/22   Janith Lima, MD  trimethoprim-polymyxin b (POLYTRIM) ophthalmic solution Place 2 drops into the right eye every 4 (four) hours. 04/19/22   Eulas Post, MD    Physical Exam: Vitals:   04/20/22 1542 04/20/22 1545 04/20/22  1546 04/20/22 1830  BP: (!) 158/89 (!) 158/89  (!) 142/84  Pulse: (!) 108 97  97  Resp: 20   13  Temp:   98.2 F (36.8 C)   TempSrc: Oral  Oral   SpO2: 98% 100%  100%   General:  Appears calm and comfortable and is in NAD Eyes:  PERRL, EOMI, normal lids, iris ENT:  grossly normal hearing, lips & tongue, mmm; appropriate dentition. TM pearly with light reflex bilaterally. Normal external canals bilaterally  Neck:  no LAD, masses or thyromegaly; no carotid bruits Cardiovascular:  RRR, no m/r/g. No LE edema.  Respiratory: no air movement in LLL, otherwise normal exam. No wheezing/rales/rhonchi. Normal respiratory effort.  Abdomen:  soft, NT, ND, NABS Back:   normal alignment, no CVAT Skin:  no rash or induration seen on limited exam Musculoskeletal:  grossly normal tone BUE/BLE, good ROM, no bony abnormality Lower extremity:  No LE edema.  Limited foot exam with no ulcerations.  2+ distal pulses. Psychiatric:  mood and affect slow. Delayed at times to answer. Seems nervous, speech fluent and appropriate, AOx3 Neurologic:  CN 2-12 grossly intact, moves all extremities in coordinated fashion, sensation intact.    Radiological Exams on Admission: Independently reviewed - see discussion in A/P where applicable  CT Head Wo Contrast  Result Date: 04/20/2022 CLINICAL DATA:  57 year old female with history of altered mental status. EXAM: CT HEAD WITHOUT CONTRAST TECHNIQUE: Contiguous axial images were obtained from the base of the skull through the vertex without intravenous contrast. RADIATION DOSE REDUCTION: This exam was performed according to the departmental dose-optimization program which includes automated exposure control, adjustment of the mA and/or kV according to patient size and/or use of iterative reconstruction technique. COMPARISON:  No priors. FINDINGS: Brain: No evidence of acute infarction, hemorrhage, hydrocephalus, extra-axial collection or mass lesion/mass effect. Vascular: No  hyperdense vessel or unexpected calcification. Skull: Normal. Negative for fracture or focal lesion. Sinuses/Orbits: No acute finding. Other: None. IMPRESSION: 1. No acute intracranial abnormalities. The appearance of the brain is normal. Electronically Signed   By: Vinnie Langton M.D.   On: 04/20/2022 16:44   DG Chest 2 View  Result Date: 04/20/2022 CLINICAL DATA:  Cough, altered level of consciousness, possible sepsis EXAM: CHEST - 2 VIEW COMPARISON:  None Available. FINDINGS: Frontal and lateral views of the chest demonstrate an unremarkable cardiac silhouette. Dense left lower lobe airspace disease consistent with pneumonia. Trace left parapneumonic effusion. Right chest is clear. No pneumothorax. IMPRESSION: 1. Dense left lower lobe pneumonia, with trace left parapneumonic effusion. Electronically Signed   By: Randa Ngo M.D.   On: 04/20/2022 16:22    EKG: Independently reviewed.  Sinus tachycardia with rate 102; nonspecific ST changes with no evidence of acute ischemia. PVCs new from prior EKG and faster rate    Labs on Admission: I have personally reviewed the available labs and imaging  studies at the time of the admission.  Pertinent labs:   wbc: 14.2,  hgb: 10.7 ,  sodium: 133,  potassium :2.6,   Assessment and Plan: Principal Problem:   Sepsis due to pneumonia [facility]) Active Problems:   Acute encephalopathy   Hypokalemia   Conjunctivitis   Normocytic anemia   Essential hypertension   Microscopic hematuria   Sepsis ([place])    Assessment and Plan: * Sepsis due to pneumonia [facility]) 57 year old female presenting with one week of upper respiratory symptoms of productive cough, fever, shortness of breath with altered mental status x 2 days found to be septic from LLL pneumonia  -obs to telemetry  -Infiltrate on CXR and 2-3 characteristics (fever, leukocytosis, purulent sputum) are consistent with pneumonia. -likely CAP, rocephin given in ED, start azithromycin -check  legionella and strep pneumonia urinary antigens  -BC pending -lactic acid wnl, PCT baseline>trend -check covid/flu/rsv and RVP -sputum culture -robitussin DM -IS to bedside -duoneb x 2  -SABA PRN -gentle IVF  Acute encephalopathy Likely secondary to sepsis from pneumonia Head CT with no acute finding-no neuro deficits on exam. A&O x 3. Treat pneumonia, BC pending UDS negative  Check metabolic labs  Will do swallow screen, but alert and oriented with good laryngeal elevation.  If no improvement in encephalopathy as sepsis resolves consider MRI and psychiatry consult    Hypokalemia Magnesium wnl Repleted in ED Continue home replacement: 15mq BID  Trend and replete as needed   Conjunctivitis Does not wear contact lens Started on polytrim, will continue   Normocytic anemia No hx of anemia and past cbc wnl Check iron studies Trend   Microscopic hematuria Moderate hgb with 11-20 RBC Repeat UA tomorrow Check culture  If + start hematuria w/u   Essential hypertension Elevated, but has not taken her home medication Continue lozol 10000000daily and bystolic [id number]  PRN metoprolol IV, parameters written     Advance Care Planning:   Code Status: Full Code   Consults: none   DVT Prophylaxis: lovenox   Family Communication: mother at bedside  Severity of Illness: The appropriate patient status for this patient is OBSERVATION. Observation status is judged to be reasonable and necessary in order to provide the required intensity of service to ensure the patient's safety. The patient's presenting symptoms, physical exam findings, and initial radiographic and laboratory data in the context of their medical condition is felt to place them at decreased risk for further clinical deterioration. Furthermore, it is anticipated that the patient will be medically stable for discharge from the hospital within 2 midnights of admission.   Author: AOrma Flaming MD 04/20/2022 8:14  PM  For on call review [email or web address]

## 2022-04-21 ENCOUNTER — Other Ambulatory Visit: Payer: Self-pay

## 2022-04-21 ENCOUNTER — Observation Stay (HOSPITAL_COMMUNITY): Payer: 59

## 2022-04-21 DIAGNOSIS — R1310 Dysphagia, unspecified: Secondary | ICD-10-CM | POA: Diagnosis present

## 2022-04-21 DIAGNOSIS — Z888 Allergy status to other drugs, medicaments and biological substances status: Secondary | ICD-10-CM | POA: Diagnosis not present

## 2022-04-21 DIAGNOSIS — Z8249 Family history of ischemic heart disease and other diseases of the circulatory system: Secondary | ICD-10-CM | POA: Diagnosis not present

## 2022-04-21 DIAGNOSIS — Z1152 Encounter for screening for COVID-19: Secondary | ICD-10-CM | POA: Diagnosis not present

## 2022-04-21 DIAGNOSIS — A419 Sepsis, unspecified organism: Secondary | ICD-10-CM | POA: Diagnosis not present

## 2022-04-21 DIAGNOSIS — G9340 Encephalopathy, unspecified: Secondary | ICD-10-CM | POA: Diagnosis not present

## 2022-04-21 DIAGNOSIS — J040 Acute laryngitis: Secondary | ICD-10-CM | POA: Diagnosis present

## 2022-04-21 DIAGNOSIS — Z602 Problems related to living alone: Secondary | ICD-10-CM | POA: Diagnosis present

## 2022-04-21 DIAGNOSIS — D649 Anemia, unspecified: Secondary | ICD-10-CM | POA: Diagnosis present

## 2022-04-21 DIAGNOSIS — J918 Pleural effusion in other conditions classified elsewhere: Secondary | ICD-10-CM | POA: Diagnosis present

## 2022-04-21 DIAGNOSIS — R4182 Altered mental status, unspecified: Secondary | ICD-10-CM | POA: Diagnosis present

## 2022-04-21 DIAGNOSIS — J189 Pneumonia, unspecified organism: Secondary | ICD-10-CM | POA: Diagnosis present

## 2022-04-21 DIAGNOSIS — H109 Unspecified conjunctivitis: Secondary | ICD-10-CM | POA: Diagnosis present

## 2022-04-21 DIAGNOSIS — I10 Essential (primary) hypertension: Secondary | ICD-10-CM | POA: Diagnosis present

## 2022-04-21 DIAGNOSIS — R3129 Other microscopic hematuria: Secondary | ICD-10-CM | POA: Diagnosis present

## 2022-04-21 DIAGNOSIS — E876 Hypokalemia: Secondary | ICD-10-CM | POA: Diagnosis present

## 2022-04-21 DIAGNOSIS — Z79899 Other long term (current) drug therapy: Secondary | ICD-10-CM | POA: Diagnosis not present

## 2022-04-21 DIAGNOSIS — J069 Acute upper respiratory infection, unspecified: Secondary | ICD-10-CM | POA: Diagnosis present

## 2022-04-21 LAB — RESPIRATORY PANEL BY PCR

## 2022-04-21 LAB — PROCALCITONIN
Procalcitonin: <0.1 ng/mL
Procalcitonin: <0.1 ng/mL

## 2022-04-21 LAB — HIV ANTIBODY (ROUTINE TESTING W REFLEX): HIV Screen 4th Generation wRfx: NONREACTIVE

## 2022-04-21 LAB — BASIC METABOLIC PANEL
Anion gap: 11 (ref 5–15)
BUN: 9 mg/dL (ref 6–20)
CO2: 25 mmol/L (ref 22–32)
Calcium: 8.6 mg/dL — ABNORMAL LOW (ref 8.9–10.3)
Chloride: 100 mmol/L (ref 98–111)
Creatinine, Ser: 0.78 mg/dL (ref 0.44–1.00)
GFR, Estimated: >60 mL/min (ref >60)
Glucose, Bld: 100 mg/dL — ABNORMAL HIGH (ref 70–99)
Potassium: 3.5 mmol/L (ref 3.5–5.1)
Sodium: 136 mmol/L (ref 135–145)

## 2022-04-21 LAB — CBC
HCT: 33.1 % — ABNORMAL LOW (ref 36.0–46.0)
Hemoglobin: 10.8 g/dL — ABNORMAL LOW (ref 12.0–15.0)
MCH: 27.9 pg (ref 26.0–34.0)
MCHC: 32.6 g/dL (ref 30.0–36.0)
MCV: 85.5 fL (ref 80.0–100.0)
Platelets: 330 10*3/uL (ref 150–400)
RBC: 3.87 MIL/uL (ref 3.87–5.11)
RDW: 15.6 % — ABNORMAL HIGH (ref 11.5–15.5)
WBC: 16.2 10*3/uL — ABNORMAL HIGH (ref 4.0–10.5)
nRBC: 0 % (ref 0.0–0.2)

## 2022-04-21 LAB — BRAIN NATRIURETIC PEPTIDE: B Natriuretic Peptide: 34 pg/mL (ref 0.0–100.0)

## 2022-04-21 LAB — STREP PNEUMONIAE URINARY ANTIGEN: Strep Pneumo Urinary Antigen: NEGATIVE

## 2022-04-21 MED ORDER — SENNOSIDES-DOCUSATE SODIUM 8.6-50 MG PO TABS: 1.0000 | ORAL_TABLET | Freq: Every evening | ORAL | Status: DC | PRN

## 2022-04-21 MED ORDER — SODIUM CHLORIDE 0.9 % IV SOLN
1.0000 g | INTRAVENOUS | Status: DC
  Administered 2022-04-21 – 2022-04-23 (×3): 1 g via INTRAVENOUS
  Filled 2022-04-21 (×3): qty 10

## 2022-04-21 MED ORDER — HYDRALAZINE HCL 20 MG/ML IJ SOLN: 10.0000 mg | INTRAMUSCULAR | Status: DC | PRN

## 2022-04-21 MED ORDER — TRAZODONE HCL 50 MG PO TABS
50.0000 mg | ORAL_TABLET | Freq: Every evening | ORAL | Status: DC | PRN
  Administered 2022-04-21: 50 mg via ORAL
  Filled 2022-04-21: qty 1

## 2022-04-21 MED ORDER — METOPROLOL TARTRATE 5 MG/5ML IV SOLN: 5.0000 mg | INTRAVENOUS | Status: DC | PRN

## 2022-04-21 MED ORDER — FERROUS SULFATE 325 (65 FE) MG PO TABS
325.0000 mg | ORAL_TABLET | Freq: Every day | ORAL | Status: DC
  Administered 2022-04-21 – 2022-04-24 (×4): 325 mg via ORAL
  Filled 2022-04-21 (×4): qty 1

## 2022-04-21 MED ORDER — REVEFENACIN 175 MCG/3ML IN SOLN: 175.0000 ug | Freq: Every day | RESPIRATORY_TRACT | Status: DC

## 2022-04-21 MED ORDER — SODIUM CHLORIDE 0.9 % IV SOLN
500.0000 mg | INTRAVENOUS | Status: DC
  Administered 2022-04-21 – 2022-04-23 (×3): 500 mg via INTRAVENOUS
  Filled 2022-04-21 (×4): qty 5

## 2022-04-21 MED ORDER — ARFORMOTEROL TARTRATE 15 MCG/2ML IN NEBU
15.0000 ug | INHALATION_SOLUTION | Freq: Two times a day (BID) | RESPIRATORY_TRACT | Status: DC
  Administered 2022-04-21: 15 ug via RESPIRATORY_TRACT
  Filled 2022-04-21: qty 2

## 2022-04-21 MED ORDER — THIAMINE HCL 100 MG/ML IJ SOLN
500.0000 mg | INTRAVENOUS | Status: DC
  Administered 2022-04-21 – 2022-04-23 (×3): 500 mg via INTRAVENOUS
  Filled 2022-04-21 (×4): qty 5

## 2022-04-21 MED ORDER — IPRATROPIUM-ALBUTEROL 0.5-2.5 (3) MG/3ML IN SOLN: 3.0000 mL | RESPIRATORY_TRACT | Status: DC | PRN

## 2022-04-21 NOTE — Progress Notes (Signed)
PROGRESS NOTE    Tracy Walton  [id number] DOB: November 07, 1965 DOA: 04/20/2022 PCP: Janith Lima, MD   Brief Narrative:  57 y.o. female with medical history significant of HTN who presented to ED after being seen at [facility] and found to be confused with concern for possible sepsis with recent viral infection vs. Electrolyte issue with her excessive water drinking.  She was found to have altered mental status and community-acquired pneumonia.  She was started on IV Rocephin and azithromycin.  CT of the head was negative.   Assessment & Plan:  Principal Problem:   Sepsis due to pneumonia [facility]) Active Problems:   Acute encephalopathy   Hypokalemia   Conjunctivitis   Normocytic anemia   Essential hypertension   Microscopic hematuria   Sepsis ([place])     Assessment and Plan: Left lower lobe consolidation No evidence of obvious sepsis.  Concerns of left lower lobe pneumonia with likely component of acute bronchitis.  Does not really have much of respiratory symptoms.  As needed bronchodilators, procalcitonin and BNP negative.  Discontinue antibiotics - Strep pneumo antigen, viral panel, COVID/flu/RSV all negative -Chest x-ray shows parapneumonic effusion, will get CT chest  Acute encephalopathy -Likely from underlying infection.  CT of the head is negative.  No focal neurodeficits.  UDS, UA, TSH, ammonia are all unremarkable. Per family this started about a week ago since her recent URI.  Will order MRI brain.  Start high-dose thiamine X5 days  Occasional dysphagia - Wonder if this is related to recent laryngitis.  Will get speech and swallow evaluation  Hypokalemia As needed repletion  Conjunctivitis Does not wear contact lens Started on polytrim, will continue   Normocytic anemia Low iron saturations, supplements  Microscopic hematuria No signs of infection but positive dipstick.  Check CK  Essential hypertension Currently on Lozol and Bystolic.  IV as needed        DVT prophylaxis: Lovenox Code Status: Full code Family Communication: Spoke with patient's Father Jori Moll  Continue hospital stay due to abnormal mental status   Subjective: Patient seen and examined at bedside.  Keeps on asking me the same question regarding her plan of care.  Also reports of swallowing difficulty.  She keeps on going back to asking same questions.  I spoke with the patient's father over the phone who tells me all of these issues started about a week ago when she was diagnosed with upper respiratory viral infection.  Otherwise she is independent and does not have any issues with swallowing, mentation or stuttering.   Examination:  General exam: Appears calm and comfortable, at times keeps asking the same questions. Respiratory system: Clear to auscultation. Respiratory effort normal. Cardiovascular system: S1 & S2 heard, RRR. No JVD, murmurs, rubs, gallops or clicks. No pedal edema. Gastrointestinal system: Abdomen is nondistended, soft and nontender. No organomegaly or masses felt. Normal bowel sounds heard. Central nervous system: Alert and oriented. No focal neurological deficits.  But she has some stuttering Extremities: Symmetric 5 x 5 power. Skin: No rashes, lesions or ulcers Psychiatry: Judgement and insight appear poor.   Objective: Vitals:   04/20/22 2214 04/21/22 0000 04/21/22 0205 04/21/22 0355  BP: 134/68 133/79 116/84 128/76  Pulse: (!) 113 100 92 95  Resp: 20 18 18 18  $ Temp: 98.6 F (37 C) 99.7 F (37.6 C) 98.5 F (36.9 C) 98.2 F (36.8 C)  TempSrc: Oral Oral Oral Oral  SpO2: 100% 100% 100% 98%  Weight:      Height:  5'  2" (1.575 m)      Intake/Output Summary (Last 24 hours) at 04/21/2022 0759 Last data filed at 04/21/2022 0640 Gross per 24 hour  Intake 2120.54 ml  Output 500 ml  Net 1620.54 ml   Filed Weights   04/20/22 2203  Weight: 59.4 kg     Data Reviewed:   CBC: Recent Labs  Lab 04/20/22 1550 04/21/22 0338  WBC  14.2* 16.2*  NEUTROABS 10.8*  --   HGB 10.7* 10.8*  HCT 32.7* 33.1*  MCV 86.7 85.5  PLT 341 [id number]   Basic Metabolic Panel: Recent Labs  Lab 04/20/22 1550 04/21/22 0338  NA 133* 136  K 2.6* 3.5  CL 88* 100  CO2 31 25  GLUCOSE 110* 100*  BUN 14 9  CREATININE 0.90 0.78  CALCIUM 9.1 8.6*  MG 2.2  --    GFR: Estimated Creatinine Clearance: 62.8 mL/min (by C-G formula based on SCr of 0.78 mg/dL). Liver Function Tests: Recent Labs  Lab 04/20/22 1550  AST 37  ALT 53*  ALKPHOS 60  BILITOT 0.8  PROT 7.9  ALBUMIN 3.3*   No results for input(s): "LIPASE", "AMYLASE" in the last 168 hours. Recent Labs  Lab 04/20/22 1841  AMMONIA 11   Coagulation Profile: No results for input(s): "INR", "PROTIME" in the last 168 hours. Cardiac Enzymes: No results for input(s): "CKTOTAL", "CKMB", "CKMBINDEX", "TROPONINI" in the last 168 hours. BNP (last 3 results) No results for input(s): "PROBNP" in the last 8760 hours. HbA1C: No results for input(s): "HGBA1C" in the last 72 hours. CBG: Recent Labs  Lab 04/20/22 1715  GLUCAP 120*   Lipid Profile: No results for input(s): "CHOL", "HDL", "LDLCALC", "TRIG", "CHOLHDL", "LDLDIRECT" in the last 72 hours. Thyroid Function Tests: Recent Labs    04/20/22 1935  TSH 1.267   Anemia Panel: Recent Labs    04/20/22 1935  VITAMINB12 689  FOLATE 14.4  FERRITIN 334*  TIBC 223*  IRON 23*   Sepsis Labs: Recent Labs  Lab 04/20/22 1550 04/21/22 0338  PROCALCITON <0.10 <0.10  LATICACIDVEN 1.4  --     Recent Results (from the past 240 hour(s))  Blood culture (routine x 2)     Status: None (Preliminary result)   Collection Time: 04/20/22  6:41 PM   Specimen: BLOOD  Result Value Ref Range Status   Specimen Description BLOOD LEFT ANTECUBITAL  Final   Special Requests   Final    BOTTLES DRAWN AEROBIC AND ANAEROBIC Blood Culture results may not be optimal due to an inadequate volume of blood received in culture bottles Performed at [facility] [address], [place], [place] [address]    Culture PENDING  Incomplete   Report Status PENDING  Incomplete  Respiratory (~20 pathogens) panel by PCR     Status: None   Collection Time: 04/20/22  8:22 PM   Specimen: Nasopharyngeal Swab; Respiratory  Result Value Ref Range Status   Adenovirus NOT DETECTED NOT DETECTED Final   Coronavirus 229E NOT DETECTED NOT DETECTED Final    Comment: (NOTE) The Coronavirus on the Respiratory Panel, DOES NOT test for the novel  Coronavirus (2019 nCoV)    Coronavirus HKU1 NOT DETECTED NOT DETECTED Final   Coronavirus NL63 NOT DETECTED NOT DETECTED Final   Coronavirus OC43 NOT DETECTED NOT DETECTED Final   Metapneumovirus NOT DETECTED NOT DETECTED Final   Rhinovirus / Enterovirus NOT DETECTED NOT DETECTED Final   Influenza A NOT DETECTED NOT DETECTED Final   Influenza B NOT DETECTED  NOT DETECTED Final   Parainfluenza Virus 1 NOT DETECTED NOT DETECTED Final   Parainfluenza Virus 2 NOT DETECTED NOT DETECTED Final   Parainfluenza Virus 3 NOT DETECTED NOT DETECTED Final   Parainfluenza Virus 4 NOT DETECTED NOT DETECTED Final   Respiratory Syncytial Virus NOT DETECTED NOT DETECTED Final   Bordetella pertussis NOT DETECTED NOT DETECTED Final   Bordetella Parapertussis NOT DETECTED NOT DETECTED Final   Chlamydophila pneumoniae NOT DETECTED NOT DETECTED Final   Mycoplasma pneumoniae NOT DETECTED NOT DETECTED Final    Comment: Performed at [facility], [place] [address] [place], [place] [address]  Resp panel by RT-PCR (RSV, Flu A&B, Covid) Nasopharyngeal Swab     Status: None   Collection Time: 04/20/22  8:22 PM   Specimen: Nasopharyngeal Swab; Nasal Swab  Result Value Ref Range Status   SARS Coronavirus 2 by RT PCR NEGATIVE NEGATIVE Final    Comment: (NOTE) SARS-CoV-2 target nucleic acids are NOT DETECTED.  The SARS-CoV-2 RNA is generally detectable in upper respiratory specimens during the acute phase of infection. The  lowest concentration of SARS-CoV-2 viral copies this assay can detect is 138 copies/mL. A negative result does not preclude SARS-Cov-2 infection and should not be used as the sole basis for treatment or other patient management decisions. A negative result may occur with  improper specimen collection/handling, submission of specimen other than nasopharyngeal swab, presence of viral mutation(s) within the areas targeted by this assay, and inadequate number of viral copies(<138 copies/mL). A negative result must be combined with clinical observations, patient history, and epidemiological information. The expected result is Negative.  Fact Sheet for Patients:  EntrepreneurPulse.com.au  Fact Sheet for Healthcare Providers:  IncredibleEmployment.be  This test is no t yet approved or cleared by the [place] FDA and  has been authorized for detection and/or diagnosis of SARS-CoV-2 by FDA under an Emergency Use Authorization (EUA). This EUA will remain  in effect (meaning this test can be used) for the duration of the COVID-19 declaration under Section 564(b)(1) of the Act, 21 U.S.C.section 360bbb-3(b)(1), unless the authorization is terminated  or revoked sooner.       Influenza A by PCR NEGATIVE NEGATIVE Final   Influenza B by PCR NEGATIVE NEGATIVE Final    Comment: (NOTE) The Xpert Xpress SARS-CoV-2/FLU/RSV plus assay is intended as an aid in the diagnosis of influenza from Nasopharyngeal swab specimens and should not be used as a sole basis for treatment. Nasal washings and aspirates are unacceptable for Xpert Xpress SARS-CoV-2/FLU/RSV testing.  Fact Sheet for Patients: EntrepreneurPulse.com.au  Fact Sheet for Healthcare Providers: IncredibleEmployment.be  This test is not yet approved or cleared by the [place] FDA and has been authorized for detection and/or diagnosis of SARS-CoV-2 by FDA under  an Emergency Use Authorization (EUA). This EUA will remain in effect (meaning this test can be used) for the duration of the COVID-19 declaration under Section 564(b)(1) of the Act, 21 U.S.C. section 360bbb-3(b)(1), unless the authorization is terminated or revoked.     Resp Syncytial Virus by PCR NEGATIVE NEGATIVE Final    Comment: (NOTE) Fact Sheet for Patients: EntrepreneurPulse.com.au  Fact Sheet for Healthcare Providers: IncredibleEmployment.be  This test is not yet approved or cleared by the [place] FDA and has been authorized for detection and/or diagnosis of SARS-CoV-2 by FDA under an Emergency Use Authorization (EUA). This EUA will remain in effect (meaning this test can be used) for the duration of the COVID-19 declaration under Section 564(b)(1) of the  Act, 21 U.S.C. section 360bbb-3(b)(1), unless the authorization is terminated or revoked.  Performed at [facility], [place] [address] [place], [place] [address]   Blood culture (routine x 2)     Status: None (Preliminary result)   Collection Time: 04/20/22  9:55 PM   Specimen: BLOOD RIGHT HAND  Result Value Ref Range Status   Specimen Description   Final    BLOOD RIGHT HAND Performed at [facility], [place] [address], [place], [place] [address]    Special Requests   Final    BOTTLES DRAWN AEROBIC ONLY Blood Culture adequate volume Performed at [facility] [address], [place], [place] [address]    Culture PENDING  Incomplete   Report Status PENDING  Incomplete         Radiology Studies: CT Head Wo Contrast  Result Date: 04/20/2022 CLINICAL DATA:  57 year old female with history of altered mental status. EXAM: CT HEAD WITHOUT CONTRAST TECHNIQUE: Contiguous axial images were obtained from the base of the skull through the vertex without intravenous contrast. RADIATION DOSE REDUCTION: This exam was performed according to the  departmental dose-optimization program which includes automated exposure control, adjustment of the mA and/or kV according to patient size and/or use of iterative reconstruction technique. COMPARISON:  No priors. FINDINGS: Brain: No evidence of acute infarction, hemorrhage, hydrocephalus, extra-axial collection or mass lesion/mass effect. Vascular: No hyperdense vessel or unexpected calcification. Skull: Normal. Negative for fracture or focal lesion. Sinuses/Orbits: No acute finding. Other: None. IMPRESSION: 1. No acute intracranial abnormalities. The appearance of the brain is normal. Electronically Signed   By: Vinnie Langton M.D.   On: 04/20/2022 16:44   DG Chest 2 View  Result Date: 04/20/2022 CLINICAL DATA:  Cough, altered level of consciousness, possible sepsis EXAM: CHEST - 2 VIEW COMPARISON:  None Available. FINDINGS: Frontal and lateral views of the chest demonstrate an unremarkable cardiac silhouette. Dense left lower lobe airspace disease consistent with pneumonia. Trace left parapneumonic effusion. Right chest is clear. No pneumothorax. IMPRESSION: 1. Dense left lower lobe pneumonia, with trace left parapneumonic effusion. Electronically Signed   By: Randa Ngo M.D.   On: 04/20/2022 16:22        Scheduled Meds:  enoxaparin (LOVENOX) injection  40 mg Subcutaneous Q24H   indapamide  1.25 mg Oral Daily   nebivolol  10 mg Oral Daily   potassium chloride  10 mEq Oral BID   trimethoprim-polymyxin b  2 drop Right Eye Q4H   Continuous Infusions:  sodium chloride 75 mL/hr at 04/21/22 0636   azithromycin 500 mg (04/20/22 1930)     LOS: 0 days   Time spent= 35 mins    Valaria Kohut Arsenio Loader, MD [facility]  If 7PM-7AM, please contact night-coverage  04/21/2022, 7:59 AM

## 2022-04-21 NOTE — Plan of Care (Signed)
  Problem: Safety: Goal: Ability to remain free from injury will improve Outcome: Progressing   Problem: Coping: Goal: Level of anxiety will decrease Outcome: Progressing   Problem: Activity: Goal: Risk for activity intolerance will decrease Outcome: Progressing   Problem: Education: Goal: Knowledge of General Education information will improve Description: Including pain rating scale, medication(s)/side effects and non-pharmacologic comfort measures Outcome: Progressing

## 2022-04-21 NOTE — Evaluation (Signed)
Clinical/Bedside Swallow Evaluation Patient Details  Name: Silke Drayer MRN: [id number] Date of Birth: 30-Jan-1966  Today's Date: 04/21/2022 Time: SLP Start Time (ACUTE ONLY): [id number] SLP Stop Time (ACUTE ONLY): 1545 SLP Time Calculation (min) (ACUTE ONLY): 30 min  Past Medical History: History reviewed. No pertinent past medical history. Past Surgical History: History reviewed. No pertinent surgical history. HPI:  57 y.o. female with medical history significant of HTN who presented to ED after being seen at [facility] and found to be confused with concern for possible sepsis with recent viral infection vs. Electrolyte issue with her excessive water drinking.  She was found to have altered mental status and community-acquired pneumonia.  She was started on IV Rocephin and azithromycin.  CT of the head was negative.    Assessment / Plan / Recommendation  Clinical Impression  Bedside swallow evaluation complete with patient demonstrating atypical presentation of reported swallowing difficulty. Her voice is mildly hoarse but she is unable to give consistent information regarding her history so unclear if this is baseline. She did have recent laryngitis. Initially, patient complaining of inablity to swallow secretions, rolling thin saliva around in oral cavity before expectorating with clinician cueing. With po intake, patient appearing hesitant to take bolus in oral cavity, complaining of it feeling as if "rivers and air are flowing through her mouth and all around her." Although boluses were small even with clinician encouragement for larger bites/sips, and oral phase often prolonged with intermittent grimacing, all boluses swallowed with no overt indication of aspiraiton. Later in evaluation, cracker masticated in a timely manner and swallowed with patient stating "that was quite good."  Her overall performance is very inconsistent. She is tangential with poor topic maintenance. Unable to get a clear picture of  what her primary complaint is around her swallowing function. It seems based on chart review that she was previously independent. Overall cognitive presentation appearing psych in nature although cannot r/o impact of possible encephalopathy. Strongly feel that her swallowing function is largely normal but that AMS is impacting her presentation. SLP will continue to f/u. Prognosis for improved swallow good with improved mentation. RN informed. SLP Visit Diagnosis: Dysphagia, unspecified (R13.10)       Diet Recommendation Regular;Thin liquid   Liquid Administration via: Cup;Straw Medication Administration: Whole meds with liquid (can crush if needed) Supervision: Patient able to self feed Compensations: Small sips/bites Postural Changes: Seated upright at 90 degrees    Other  Recommendations Oral Care Recommendations: Oral care BID    Recommendations for follow up therapy are one component of a multi-disciplinary discharge planning process, led by the attending physician.  Recommendations may be updated based on patient status, additional functional criteria and insurance authorization.  Follow up Recommendations No SLP follow up         Functional Status Assessment Patient has had a recent decline in their functional status and demonstrates the ability to make significant improvements in function in a reasonable and predictable amount of time.  Frequency and Duration min 1 x/week  1 week       Prognosis Prognosis for improved oropharyngeal function: Good Barriers to Reach Goals: Cognitive deficits;Behavior      Swallow Study   General HPI: 57 y.o. female with medical history significant of HTN who presented to ED after being seen at [facility] and found to be confused with concern for possible sepsis with recent viral infection vs. Electrolyte issue with her excessive water drinking.  She was found to have altered mental status and community-acquired  pneumonia.  She was started on IV Rocephin  and azithromycin.  CT of the head was negative. Type of Study: Bedside Swallow Evaluation Previous Swallow Assessment: none Diet Prior to this Study: Regular;Thin liquids (Level 0) Temperature Spikes Noted: No Respiratory Status: Room air History of Recent Intubation: No Behavior/Cognition: Alert;Other (Comment) (see impression statement) Oral Cavity Assessment: Dry (dry brown coating on tongue) Oral Care Completed by SLP: Recent completion by staff Oral Cavity - Dentition: Adequate natural dentition Vision: Functional for self-feeding Self-Feeding Abilities: Able to feed self Patient Positioning: Upright in chair Baseline Vocal Quality: Hoarse (mild, unclear baseline) Volitional Cough: Strong Volitional Swallow: Able to elicit    Oral/Motor/Sensory Function Overall Oral Motor/Sensory Function: Within functional limits   Ice Chips Ice chips: Not tested   Thin Liquid Thin Liquid: Impaired Presentation: Cup;Self Fed;Straw Oral Phase Impairments: Reduced lingual movement/coordination Oral Phase Functional Implications: Oral holding;Prolonged oral transit (grimacing)    Nectar Thick Nectar Thick Liquid: Not tested   Honey Thick Honey Thick Liquid: Not tested   Puree Puree: Impaired Oral Phase Impairments: Reduced lingual movement/coordination;Impaired mastication Oral Phase Functional Implications: Oral holding;Prolonged oral transit (grimacing)   Solid     Solid: Within functional limits Presentation: Self Fed     [employer] MA, CCC-SLP  Avish Torry Meryl 04/21/2022,3:53 PM

## 2022-04-22 ENCOUNTER — Inpatient Hospital Stay (HOSPITAL_COMMUNITY)
Admit: 2022-04-22 | Discharge: 2022-04-22 | Disposition: A | Discharge status: Home / Self Care | Payer: 59 | Attending: Internal Medicine | Admitting: Internal Medicine

## 2022-04-22 ENCOUNTER — Ambulatory Visit: Payer: 59 | Admitting: Internal Medicine

## 2022-04-22 DIAGNOSIS — A419 Sepsis, unspecified organism: Secondary | ICD-10-CM | POA: Diagnosis not present

## 2022-04-22 DIAGNOSIS — R4182 Altered mental status, unspecified: Secondary | ICD-10-CM

## 2022-04-22 DIAGNOSIS — J189 Pneumonia, unspecified organism: Secondary | ICD-10-CM | POA: Diagnosis not present

## 2022-04-22 DIAGNOSIS — G9340 Encephalopathy, unspecified: Secondary | ICD-10-CM

## 2022-04-22 LAB — BASIC METABOLIC PANEL
Anion gap: 8 (ref 5–15)
BUN: 7 mg/dL (ref 6–20)
CO2: 25 mmol/L (ref 22–32)
Calcium: 8.5 mg/dL — ABNORMAL LOW (ref 8.9–10.3)
Chloride: 102 mmol/L (ref 98–111)
Creatinine, Ser: 0.7 mg/dL (ref 0.44–1.00)
GFR, Estimated: >60 mL/min (ref >60)
Glucose, Bld: 97 mg/dL (ref 70–99)
Potassium: 3.2 mmol/L — ABNORMAL LOW (ref 3.5–5.1)
Sodium: 135 mmol/L (ref 135–145)

## 2022-04-22 LAB — CBC
HCT: 29.9 % — ABNORMAL LOW (ref 36.0–46.0)
Hemoglobin: 9.8 g/dL — ABNORMAL LOW (ref 12.0–15.0)
MCH: 28.6 pg (ref 26.0–34.0)
MCHC: 32.8 g/dL (ref 30.0–36.0)
MCV: 87.2 fL (ref 80.0–100.0)
Platelets: 312 10*3/uL (ref 150–400)
RBC: 3.43 MIL/uL — ABNORMAL LOW (ref 3.87–5.11)
RDW: 15.6 % — ABNORMAL HIGH (ref 11.5–15.5)
WBC: 10.3 10*3/uL (ref 4.0–10.5)
nRBC: 0 % (ref 0.0–0.2)

## 2022-04-22 LAB — URINE CULTURE: Culture: NO GROWTH

## 2022-04-22 LAB — CK: Total CK: 163 U/L (ref 38–234)

## 2022-04-22 LAB — CORTISOL: Cortisol, Plasma: 12.6 ug/dL

## 2022-04-22 LAB — MAGNESIUM: Magnesium: 2.2 mg/dL (ref 1.7–2.4)

## 2022-04-22 LAB — PROCALCITONIN: Procalcitonin: <0.1 ng/mL

## 2022-04-22 LAB — FOLATE: Folate: 10.5 ng/mL (ref >5.9)

## 2022-04-22 MED ORDER — POTASSIUM CHLORIDE CRYS ER 20 MEQ PO TBCR
40.0000 meq | EXTENDED_RELEASE_TABLET | Freq: Once | ORAL | Status: AC
  Administered 2022-04-22: 40 meq via ORAL
  Filled 2022-04-22: qty 2

## 2022-04-22 MED ORDER — POTASSIUM CHLORIDE 10 MEQ/100ML IV SOLN
10.0000 meq | INTRAVENOUS | Status: AC
  Administered 2022-04-22 (×2): 10 meq via INTRAVENOUS
  Filled 2022-04-22 (×2): qty 100

## 2022-04-22 NOTE — Plan of Care (Signed)
  Problem: Respiratory: Goal: Ability to maintain a clear airway will improve Outcome: Progressing   Problem: Education: Goal: Knowledge of General Education information will improve Description: Including pain rating scale, medication(s)/side effects and non-pharmacologic comfort measures Outcome: Progressing   Problem: Coping: Goal: Level of anxiety will decrease Outcome: Progressing   Problem: Safety: Goal: Ability to remain free from injury will improve Outcome: Progressing

## 2022-04-22 NOTE — Consult Note (Cosign Needed Addendum)
[facility] Consult   Reason for Consult:   Referring Physician:   Patient Identification: Tracy Walton MRN:  [id number] Principal Diagnosis: Sepsis due to pneumonia [facility]) Diagnosis:  Principal Problem:   Sepsis due to pneumonia [facility]) Active Problems:   Essential hypertension   Hypokalemia   Normocytic anemia   Sepsis ([place])   Acute encephalopathy   Conjunctivitis   Microscopic hematuria   Total Time spent with patient: 1 hour  Subjective:   Tracy Walton is a 57 y.o. female patient admitted with sepsis 2/t pneumonia, with medical history significant of HTN who presented to ED after being seen at [facility] and found to be confused with concern for possible sepsis with recent viral infection vs. Electrolyte issue with her excessive water drinking.  She was found to have altered mental status and community-acquired pneumonia.   CT head and MRI brain also unremarkable.  Neurology felt as if there was a psychogenic component thus psychiatry was consulted.   On evaluation patient is alert and oriented, calm and cooperative, very pleasant upon approach.  She is open to discussing and participating in psych evaluation. Consent was obtained to speak with mother and proceed with psych evaluation during that time. Mother was available and present throughout the psych evaluation (she did present with many concerns and wanted to step outside to grant permission to interview patient prior to eval starting). 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 She does endorse periods of high stress related to work, and has engaged in EAP throughout her tenure at this time. She moved to [facility] [place] in 2020-2021 from [place].   She denies any acute symptoms of mania at this time to include impulsivity, grandiosity, mood lability, hypersexuality.  She does not present with any of the above symptoms on this evaluation.  She further denies any depressive symptoms  to include anhedonia, hopelessness, worthlessness, guilty, suicidal. She denies any emotional, verbal, sexual or physical trauma.  She denies any acute psychosis, paranoia or history there of.  She does not appear to be displaying any or responding to internal stimuli, external stimuli, or exhibiting delusional thought disorder.  Patient denies any access to weapons, denies any alcohol and or substance abuse.  She reports moderate sleep and fair appetite.  She also is receiving services through EAP. She lives alone, however staying with her mother since she developed laryngitis 1 week ago followed by URI and pneumonia. Patient denies any auditory and/or visual hallucinations, does not appear to be responding to internal or external stimuli.  There is no evidence of delusional thought content and patient appears to answer all questions appropriately.   She was also assessed for confusion and inquires about mothers collateral in which she provided appropriate responses to both. Patient states she has been recently wearing a hat due to her hair not being done. She states her money being inside an envelope on her bedside table,buried under papers(trash) "was not the safest place to store money. I do intend on cleaning that up and hopefully I will do so soon. " In regards to leaving on the lights she leaves the lights on to see when she is going to the bathroom. Unpaid bills and cash " she states I dont like using credit or debit cards, I prefer cash." She denies any abnormal behaviors, increase in forgetfulness, memory lapses, paranoia (hiding things). She denies any increase in errors at work or calculation mistakes. She does endorse this  being a busy time of year for the [place] [employer]. We did discuss self car, rest and relaxation, ongoing efforts to use EAP program. Further discussion was had about outpatient neurology for MRI findings and possible neurocog therapy and dementia preventions/slow down the progression.    On today's reassessment patient did not present with any focal neurological deficit, she was able to speak clearly(some vocal strangulation noted, follow commands, and denies weakness. She was able to perform memory recall (identify 3 words), spell WORLD backwards, count serial 7s (100-94, 93-86).   At this current time, patient's pre-existing condition that was substantially aggravated prior to this admission was unable to be identified at this time.  Patient further states that her current health at this present time seems to be normal as she is noted to feel better and like her self again. She does endorse ongoing sore throat (recently healed from laryngitis), cold, cough, and ear pain; admitted for sepsis 2/t to pneumonia.  She does continue to endorse some mental fog, overall assessing is doing better.  She does not present with any decrease in attention or concentration, She is alert and oriented x 5, shows some short-term memory deficit. She was able to appropriately respond to most questions, however she would reach for a pen for certain activities such as word recall and serial 7s. She was able to complete an accurately recall this information without the use of a pen. She denies any confabulation and delusions.  She is not lethargic on today's exam, shows linear thought processes, and answers all questions appropriately.  She used her call bell when she had to get up out of her chair, as instructed by nursing due to IV pole. Other abnormalities noted included mannerisms such as stimming observed with hands. Patient would alternate from her cup, to her hair, to wringing of her hands, to touching of her face. She also was noted to have to take a sip of water every 2 months and constantly clear her throat. She denies any history of obsessive compulsion disorder or traits. At this time it is felt that patient does not have any acute psychiatric symptoms that warrant ongoing monitoring by consult service.  She denies suicidal ideation, homicidal ideation, and or auditory or visual hallucinations.    HPI:  Tracy Walton is a 57 y.o female patient admitted for sepsis due to pneumonia. Psychiatry was consulted for the evaluation of altered mental status. Patient states that  she is single and has no children. she grew up in [place], with a sister, hal-brother and parents. She  graduated college with a degree in business administration and has been working partially remotely as an Passenger transport manager since 1990. She denies any form of trauma or legal issues.  During face to face assessment, she was observed sitting in the chair, casually dressed , appeared stated agewith her mother at bedside.    Collateral from mom: Prior to this admission mother denies any history of psychiatric concerns. Daughter has never prompted reason for psych evaluation. Patient acting normal until 02/16 when she began to perform abnormal activities in the home. Mom reports on this day she began turning lights on(leaving them on), changes clothes several times a day, opening multiple water bottles at the same time, opening food that she can't eat. On 2/17 mother discovered money inside a envelope and bills inside a trash can. Mother also discover mail with a stamp and sealed. She reports all of these behaviors are out of the patients normal.  Past Psychiatric History: Seems to be an active member in her EAP program and has received some therapy from a behavioral health specialist. She denies any history of psychiatric conditions, outside of work related stressors.  Pt denies ever been hospitalized for mental health concerns in the past. Denies any previous history of suicidal thoughts, suicidal ideations, and or non suicidal self injurious behaviors. Pt denies history of aggression, agitation, violent behavior, and or history of homicidal ideations/thoughts.  Patient further denies any current, previous legal charges.  Patient  further denies access to guns, weapons, or any engagement with the legal system.  Patient denies history of illicit substances to include synthetic substances, any cannabidiol, supplemental herbs.    Risk to Self:   Denies Risk to Others:   Denies Prior Inpatient Therapy:   Denies Prior Outpatient Therapy:   Denies  Past Medical History: History reviewed. No pertinent past medical history. History reviewed. No pertinent surgical history. Family History:  Family History  Problem Relation Age of Onset   Hypertension Mother    Hypertension Father    Breast cancer Neg Hx    Family Psychiatric  History:Paternal Aunt with dementia (older age).   Social History:  Social History   Substance and Sexual Activity  Alcohol Use Not Currently     Social History   Substance and Sexual Activity  Drug Use Never    Social History   Socioeconomic History   Marital status: Single    Spouse name: Not on file   Number of children: Not on file   Years of education: Not on file   Highest education level: Not on file  Occupational History   Not on file  Tobacco Use   Smoking status: Never   Smokeless tobacco: Never  Substance and Sexual Activity   Alcohol use: Not Currently   Drug use: Never   Sexual activity: Not Currently    Partners: Female  Other Topics Concern   Not on file  Social History Narrative   Not on file   Social Determinants of Health   Financial Resource Strain: Not on file  Food Insecurity: No Food Insecurity (04/21/2022)   Hunger Vital Sign    Worried About Running Out of Food in the Last Year: Never true    Ran Out of Food in the Last Year: Never true  Transportation Needs: No Transportation Needs (04/21/2022)   PRAPARE - Hydrologist (Medical): No    Lack of Transportation (Non-Medical): No  Physical Activity: Not on file  Stress: Not on file  Social Connections: Not on file   Additional Social History: Plans to retire soon as an  Passenger transport manager, has worked in this role since 1990. SHe has a Buyer, retail in [employer]. She relocated from [place] to [place] after graduation. She resides with her mother.     Allergies:   Allergies  Allergen Reactions   Lisinopril Swelling    Labs:  Results for orders placed or performed during the hospital encounter of 04/20/22 (from the past 48 hour(s))  CBC with Differential     Status: Abnormal   Collection Time: 04/20/22  3:50 PM  Result Value Ref Range   WBC 14.2 (H) 4.0 - 10.5 K/uL   RBC 3.77 (L) 3.87 - 5.11 MIL/uL   Hemoglobin 10.7 (L) 12.0 - 15.0 g/dL   HCT 32.7 (L) 36.0 - 46.0 %   MCV 86.7 80.0 - 100.0 fL   MCH 28.4 26.0 - 34.0 pg  MCHC 32.7 30.0 - 36.0 g/dL   RDW 15.3 11.5 - 15.5 %   Platelets 341 150 - 400 K/uL   nRBC 0.0 0.0 - 0.2 %   Neutrophils Relative % 75 %   Neutro Abs 10.8 (H) 1.7 - 7.7 K/uL   Lymphocytes Relative 12 %   Lymphs Abs 1.7 0.7 - 4.0 K/uL   Monocytes Relative 9 %   Monocytes Absolute 1.3 (H) 0.1 - 1.0 K/uL   Eosinophils Relative 1 %   Eosinophils Absolute 0.1 0.0 - 0.5 K/uL   Basophils Relative 1 %   Basophils Absolute 0.1 0.0 - 0.1 K/uL   Immature Granulocytes 2 %   Abs Immature Granulocytes 0.23 (H) 0.00 - 0.07 K/uL    Comment: Performed at [facility], [place] [address], [place], [place] [address]  Comprehensive metabolic panel     Status: Abnormal   Collection Time: 04/20/22  3:50 PM  Result Value Ref Range   Sodium 133 (L) 135 - 145 mmol/L   Potassium 2.6 (LL) 3.5 - 5.1 mmol/L    Comment: CRITICAL RESULT CALLED TO, READ BACK BY AND VERIFIED WITH BROUNSON, S @ 1704 ON 04/20/2022 BY Jason Fila, K    Chloride 88 (L) 98 - 111 mmol/L   CO2 31 22 - 32 mmol/L   Glucose, Bld 110 (H) 70 - 99 mg/dL    Comment: Glucose reference range applies only to samples taken after fasting for at least 8 hours.   BUN 14 6 - 20 mg/dL   Creatinine, Ser 0.90 0.44 - 1.00 mg/dL   Calcium 9.1 8.9 - 10.3 mg/dL   Total Protein 7.9 6.5 - 8.1  g/dL   Albumin 3.3 (L) 3.5 - 5.0 g/dL   AST 37 15 - 41 U/L   ALT 53 (H) 0 - 44 U/L   Alkaline Phosphatase 60 38 - 126 U/L   Total Bilirubin 0.8 0.3 - 1.2 mg/dL   GFR, Estimated >60 >60 mL/min    Comment: (NOTE) Calculated using the CKD-EPI Creatinine Equation (2021)    Anion gap 14 5 - 15    Comment: Performed at [facility], [place] [address], [place], [place] [address]  Lactic acid, plasma     Status: None   Collection Time: 04/20/22  3:50 PM  Result Value Ref Range   Lactic Acid, Venous 1.4 0.5 - 1.9 mmol/L    Comment: Performed at [facility], [place] [address] [place], [place] [address]  Magnesium     Status: None   Collection Time: 04/20/22  3:50 PM  Result Value Ref Range   Magnesium 2.2 1.7 - 2.4 mg/dL    Comment: Performed at [facility], [place] [address] [place], [place] [address]  Procalcitonin - Baseline     Status: None   Collection Time: 04/20/22  3:50 PM  Result Value Ref Range   Procalcitonin <0.10 ng/mL    Comment:        Interpretation: PCT (Procalcitonin) <= 0.5 ng/mL: Systemic infection (sepsis) is not likely. Local bacterial infection is possible. (NOTE)       Sepsis PCT Algorithm           Lower Respiratory Tract                                      Infection PCT Algorithm    ----------------------------     ----------------------------  PCT < 0.25 ng/mL                PCT < 0.10 ng/mL          Strongly encourage             Strongly discourage   discontinuation of antibiotics    initiation of antibiotics    ----------------------------     -----------------------------       PCT 0.25 - 0.50 ng/mL            PCT 0.10 - 0.25 ng/mL               OR       >80% decrease in PCT            Discourage initiation of                                            antibiotics      Encourage discontinuation           of antibiotics    ----------------------------     -----------------------------          PCT >= 0.50 ng/mL              PCT 0.26 - 0.50 ng/mL               AND        <80% decrease in PCT             Encourage initiation of                                             antibiotics       Encourage continuation           of antibiotics    ----------------------------     -----------------------------        PCT >= 0.50 ng/mL                  PCT > 0.50 ng/mL               AND         increase in PCT                  Strongly encourage                                      initiation of antibiotics    Strongly encourage escalation           of antibiotics                                     -----------------------------                                           PCT <= 0.25 ng/mL  OR                                        > 80% decrease in PCT                                      Discontinue / Do not initiate                                             antibiotics  Performed at [place] [address] [place], [place] [address]   CBG monitoring, ED     Status: Abnormal   Collection Time: 04/20/22  5:15 PM  Result Value Ref Range   Glucose-Capillary 120 (H) 70 - 99 mg/dL    Comment: Glucose reference range applies only to samples taken after fasting for at least 8 hours.  Urinalysis, Routine w reflex microscopic -Urine, Clean Catch     Status: Abnormal   Collection Time: 04/20/22  5:20 PM  Result Value Ref Range   Color, Urine YELLOW YELLOW   APPearance CLEAR CLEAR   Specific Gravity, Urine 1.018 1.005 - 1.030   pH 6.0 5.0 - 8.0   Glucose, UA NEGATIVE NEGATIVE mg/dL   Hgb urine dipstick MODERATE (A) NEGATIVE   Bilirubin Urine NEGATIVE NEGATIVE   Ketones, ur 20 (A) NEGATIVE mg/dL   Protein, ur 30 (A) NEGATIVE mg/dL   Nitrite NEGATIVE NEGATIVE   Leukocytes,Ua NEGATIVE NEGATIVE   RBC / HPF 11-20 0 - 5 RBC/hpf   WBC, UA 0-5 0 - 5 WBC/hpf   Bacteria, UA NONE SEEN NONE SEEN   Squamous Epithelial /  HPF 0-5 0 - 5 /HPF   Mucus PRESENT     Comment: Performed at [facility], [place] [address] [place], [place] [address]  Urine rapid drug screen (hosp performed)     Status: None   Collection Time: 04/20/22  5:20 PM  Result Value Ref Range   Opiates NONE DETECTED NONE DETECTED   Cocaine NONE DETECTED NONE DETECTED   Benzodiazepines NONE DETECTED NONE DETECTED   Amphetamines NONE DETECTED NONE DETECTED   Tetrahydrocannabinol NONE DETECTED NONE DETECTED   Barbiturates NONE DETECTED NONE DETECTED    Comment: (NOTE) DRUG SCREEN FOR MEDICAL PURPOSES ONLY.  IF CONFIRMATION IS NEEDED FOR ANY PURPOSE, NOTIFY LAB WITHIN 5 DAYS.  LOWEST DETECTABLE LIMITS FOR URINE DRUG SCREEN Drug Class                     Cutoff (ng/mL) Amphetamine and metabolites    1000 Barbiturate and metabolites    200 Benzodiazepine                 200 Opiates and metabolites        300 Cocaine and metabolites        300 THC                            50 Performed at [facility], [place] [address] [place],  [address]   Ammonia     Status: None   Collection Time: 04/20/22  6:41 PM  Result  Value Ref Range   Ammonia 11 9 - 35 umol/L    Comment: Performed at [facility], [place] [address], [place], [place] [address]  Blood culture (routine x 2)     Status: None (Preliminary result)   Collection Time: 04/20/22  6:41 PM   Specimen: BLOOD  Result Value Ref Range   Specimen Description BLOOD LEFT ANTECUBITAL    Special Requests      BOTTLES DRAWN AEROBIC AND ANAEROBIC Blood Culture results may not be optimal due to an inadequate volume of blood received in culture bottles   Culture      NO GROWTH 1 DAY Performed at [facility] [address] [place], [place] [address]    Report Status PENDING   TSH     Status: None   Collection Time: 04/20/22  7:35 PM  Result Value Ref Range   TSH 1.267 0.350 - 4.500 uIU/mL    Comment: Performed by a 3rd  Generation assay with a functional sensitivity of <=0.01 uIU/mL. Performed at [facility], [place] [address] [place], [place] [address]   Iron and TIBC     Status: Abnormal   Collection Time: 04/20/22  7:35 PM  Result Value Ref Range   Iron 23 (L) 28 - 170 ug/dL   TIBC 223 (L) 250 - 450 ug/dL   Saturation Ratios 10 (L) 10.4 - 31.8 %   UIBC 200 ug/dL    Comment: Performed at [facility], [place] [address], [place], [place] [address]  Ferritin     Status: Abnormal   Collection Time: 04/20/22  7:35 PM  Result Value Ref Range   Ferritin 334 (H) 11 - 307 ng/mL    Comment: Performed at [facility], [place] [address], [place], [place] [address]  Vitamin B12     Status: None   Collection Time: 04/20/22  7:35 PM  Result Value Ref Range   Vitamin B-12 689 180 - 914 pg/mL    Comment: (NOTE) This assay is not validated for testing neonatal or myeloproliferative syndrome specimens for Vitamin B12 levels. Performed at [facility], [place] [address] [place], [place] [address]   Folate     Status: None   Collection Time: 04/20/22  7:35 PM  Result Value Ref Range   Folate 14.4 >5.9 ng/mL    Comment: Performed at [facility], [place] [address], [place], [place] [address]  Respiratory (~20 pathogens) panel by PCR     Status: None   Collection Time: 04/20/22  8:22 PM   Specimen: Nasopharyngeal Swab; Respiratory  Result Value Ref Range   Adenovirus NOT DETECTED NOT DETECTED   Coronavirus 229E NOT DETECTED NOT DETECTED    Comment: (NOTE) The Coronavirus on the Respiratory Panel, DOES NOT test for the novel  Coronavirus (2019 nCoV)    Coronavirus HKU1 NOT DETECTED NOT DETECTED   Coronavirus NL63 NOT DETECTED NOT DETECTED   Coronavirus OC43 NOT DETECTED NOT DETECTED   Metapneumovirus NOT DETECTED NOT DETECTED   Rhinovirus / Enterovirus NOT DETECTED NOT DETECTED   Influenza A NOT DETECTED NOT DETECTED    Influenza B NOT DETECTED NOT DETECTED   Parainfluenza Virus 1 NOT DETECTED NOT DETECTED   Parainfluenza Virus 2 NOT DETECTED NOT DETECTED   Parainfluenza Virus 3 NOT DETECTED NOT DETECTED   Parainfluenza Virus 4 NOT DETECTED NOT DETECTED   Respiratory Syncytial Virus NOT DETECTED NOT DETECTED   Bordetella pertussis NOT DETECTED NOT  DETECTED   Bordetella Parapertussis NOT DETECTED NOT DETECTED   Chlamydophila pneumoniae NOT DETECTED NOT DETECTED   Mycoplasma pneumoniae NOT DETECTED NOT DETECTED    Comment: Performed at [facility], [address] [place], [place] [address]  Resp panel by RT-PCR (RSV, Flu A&B, Covid) Nasopharyngeal Swab     Status: None   Collection Time: 04/20/22  8:22 PM   Specimen: Nasopharyngeal Swab; Nasal Swab  Result Value Ref Range   SARS Coronavirus 2 by RT PCR NEGATIVE NEGATIVE    Comment: (NOTE) SARS-CoV-2 target nucleic acids are NOT DETECTED.  The SARS-CoV-2 RNA is generally detectable in upper respiratory specimens during the acute phase of infection. The lowest concentration of SARS-CoV-2 viral copies this assay can detect is 138 copies/mL. A negative result does not preclude SARS-Cov-2 infection and should not be used as the sole basis for treatment or other patient management decisions. A negative result may occur with  improper specimen collection/handling, submission of specimen other than nasopharyngeal swab, presence of viral mutation(s) within the areas targeted by this assay, and inadequate number of viral copies(<138 copies/mL). A negative result must be combined with clinical observations, patient history, and epidemiological information. The expected result is Negative.  Fact Sheet for Patients:  EntrepreneurPulse.com.au  Fact Sheet for Healthcare Providers:  IncredibleEmployment.be  This test is no t yet approved or cleared by the [place] FDA and  has been authorized for detection and/or  diagnosis of SARS-CoV-2 by FDA under an Emergency Use Authorization (EUA). This EUA will remain  in effect (meaning this test can be used) for the duration of the COVID-19 declaration under Section 564(b)(1) of the Act, 21 U.S.C.section 360bbb-3(b)(1), unless the authorization is terminated  or revoked sooner.       Influenza A by PCR NEGATIVE NEGATIVE   Influenza B by PCR NEGATIVE NEGATIVE    Comment: (NOTE) The Xpert Xpress SARS-CoV-2/FLU/RSV plus assay is intended as an aid in the diagnosis of influenza from Nasopharyngeal swab specimens and should not be used as a sole basis for treatment. Nasal washings and aspirates are unacceptable for Xpert Xpress SARS-CoV-2/FLU/RSV testing.  Fact Sheet for Patients: EntrepreneurPulse.com.au  Fact Sheet for Healthcare Providers: IncredibleEmployment.be  This test is not yet approved or cleared by the [place] FDA and has been authorized for detection and/or diagnosis of SARS-CoV-2 by FDA under an Emergency Use Authorization (EUA). This EUA will remain in effect (meaning this test can be used) for the duration of the COVID-19 declaration under Section 564(b)(1) of the Act, 21 U.S.C. section 360bbb-3(b)(1), unless the authorization is terminated or revoked.     Resp Syncytial Virus by PCR NEGATIVE NEGATIVE    Comment: (NOTE) Fact Sheet for Patients: EntrepreneurPulse.com.au  Fact Sheet for Healthcare Providers: IncredibleEmployment.be  This test is not yet approved or cleared by the [place] FDA and has been authorized for detection and/or diagnosis of SARS-CoV-2 by FDA under an Emergency Use Authorization (EUA). This EUA will remain in effect (meaning this test can be used) for the duration of the COVID-19 declaration under Section 564(b)(1) of the Act, 21 U.S.C. section 360bbb-3(b)(1), unless the authorization is terminated or revoked.  Performed  at [facility], [place] [address] [place], [place] [address]   Blood culture (routine x 2)     Status: None (Preliminary result)   Collection Time: 04/20/22  9:55 PM   Specimen: BLOOD RIGHT HAND  Result Value Ref Range   Specimen Description  BLOOD RIGHT HAND Performed at [facility], [place] [address] [place], [place] [address]    Special Requests      BOTTLES DRAWN AEROBIC ONLY Blood Culture adequate volume Performed at [place] [address] [place], [place] [address]    Culture      NO GROWTH 1 DAY Performed at [facility] [address] [place], [place] [address]    Report Status PENDING   HIV Antibody (routine testing w rflx)     Status: None   Collection Time: 04/20/22  9:55 PM  Result Value Ref Range   HIV Screen 4th Generation wRfx Non Reactive Non Reactive    Comment: Performed at [facility], [place] [address] [place], [place] [address]  Strep pneumoniae urinary antigen     Status: None   Collection Time: 04/20/22 11:15 PM  Result Value Ref Range   Strep Pneumo Urinary Antigen NEGATIVE NEGATIVE    Comment:        Infection due to S. pneumoniae cannot be absolutely ruled out since the antigen present may be below the detection limit of the test. Performed at [facility], [address] [place], [place] [address]   Urine Culture (for pregnant, neutropenic or urologic patients or patients with an indwelling urinary catheter)     Status: None   Collection Time: 04/20/22 11:15 PM   Specimen: Urine, Clean Catch  Result Value Ref Range   Specimen Description      URINE, CLEAN CATCH Performed at [facility], [place] [address], [place], [place] [address]    Special Requests      NONE Performed at [facility], [place] [address], [place], [place] [address]    Culture      NO GROWTH Performed at [facility], [place] [address], [place], [place] [address]     Report Status 04/22/2022 FINAL   Procalcitonin     Status: None   Collection Time: 04/21/22  3:38 AM  Result Value Ref Range   Procalcitonin <0.10 ng/mL    Comment:        Interpretation: PCT (Procalcitonin) <= 0.5 ng/mL: Systemic infection (sepsis) is not likely. Local bacterial infection is possible. (NOTE)       Sepsis PCT Algorithm           Lower Respiratory Tract                                      Infection PCT Algorithm    ----------------------------     ----------------------------         PCT < 0.25 ng/mL                PCT < 0.10 ng/mL          Strongly encourage             Strongly discourage   discontinuation of antibiotics    initiation of antibiotics    ----------------------------     -----------------------------       PCT 0.25 - 0.50 ng/mL            PCT 0.10 - 0.25 ng/mL               OR       >80% decrease in PCT  Discourage initiation of                                            antibiotics      Encourage discontinuation           of antibiotics    ----------------------------     -----------------------------         PCT >= 0.50 ng/mL              PCT 0.26 - 0.50 ng/mL               AND        <80% decrease in PCT             Encourage initiation of                                             antibiotics       Encourage continuation           of antibiotics    ----------------------------     -----------------------------        PCT >= 0.50 ng/mL                  PCT > 0.50 ng/mL               AND         increase in PCT                  Strongly encourage                                      initiation of antibiotics    Strongly encourage escalation           of antibiotics                                     -----------------------------                                           PCT <= 0.25 ng/mL                                                 OR                                        > 80% decrease in PCT                                       Discontinue / Do not initiate  antibiotics  Performed at [facility], [place] [address], [place], [place] [id number]   Basic metabolic panel     Status: Abnormal   Collection Time: 04/21/22  3:38 AM  Result Value Ref Range   Sodium 136 135 - 145 mmol/L   Potassium 3.5 3.5 - 5.1 mmol/L   Chloride 100 98 - 111 mmol/L   CO2 25 22 - 32 mmol/L   Glucose, Bld 100 (H) 70 - 99 mg/dL    Comment: Glucose reference range applies only to samples taken after fasting for at least 8 hours.   BUN 9 6 - 20 mg/dL   Creatinine, Ser 0.78 0.44 - 1.00 mg/dL   Calcium 8.6 (L) 8.9 - 10.3 mg/dL   GFR, Estimated >60 >60 mL/min    Comment: (NOTE) Calculated using the CKD-EPI Creatinine Equation (2021)    Anion gap 11 5 - 15    Comment: Performed at [facility], [place] [address] [place], [place] [address]  CBC     Status: Abnormal   Collection Time: 04/21/22  3:38 AM  Result Value Ref Range   WBC 16.2 (H) 4.0 - 10.5 K/uL   RBC 3.87 3.87 - 5.11 MIL/uL   Hemoglobin 10.8 (L) 12.0 - 15.0 g/dL   HCT 33.1 (L) 36.0 - 46.0 %   MCV 85.5 80.0 - 100.0 fL   MCH 27.9 26.0 - 34.0 pg   MCHC 32.6 30.0 - 36.0 g/dL   RDW 15.6 (H) 11.5 - 15.5 %   Platelets 330 150 - 400 K/uL   nRBC 0.0 0.0 - 0.2 %    Comment: Performed at [facility], [place] [address], [place], [place] [address]  Brain natriuretic peptide     Status: None   Collection Time: 04/21/22  3:38 AM  Result Value Ref Range   B Natriuretic Peptide 34.0 0.0 - 100.0 pg/mL    Comment: Performed at [facility], [place] [address] [place], [place] [address]  Procalcitonin     Status: None   Collection Time: 04/22/22  3:43 AM  Result Value Ref Range   Procalcitonin <0.10 ng/mL    Comment:        Interpretation: PCT (Procalcitonin) <= 0.5 ng/mL: Systemic infection (sepsis) is not likely. Local bacterial infection is possible. (NOTE)        Sepsis PCT Algorithm           Lower Respiratory Tract                                      Infection PCT Algorithm    ----------------------------     ----------------------------         PCT < 0.25 ng/mL                PCT < 0.10 ng/mL          Strongly encourage             Strongly discourage   discontinuation of antibiotics    initiation of antibiotics    ----------------------------     -----------------------------       PCT 0.25 - 0.50 ng/mL            PCT 0.10 - 0.25 ng/mL               OR       >80% decrease in PCT  Discourage initiation of                                            antibiotics      Encourage discontinuation           of antibiotics    ----------------------------     -----------------------------         PCT >= 0.50 ng/mL              PCT 0.26 - 0.50 ng/mL               AND        <80% decrease in PCT             Encourage initiation of                                             antibiotics       Encourage continuation           of antibiotics    ----------------------------     -----------------------------        PCT >= 0.50 ng/mL                  PCT > 0.50 ng/mL               AND         increase in PCT                  Strongly encourage                                      initiation of antibiotics    Strongly encourage escalation           of antibiotics                                     -----------------------------                                           PCT <= 0.25 ng/mL                                                 OR                                        > 80% decrease in PCT                                      Discontinue / Do not initiate  antibiotics  Performed at [facility], [place] [address] [place], [place] [id number]   Basic metabolic panel     Status: Abnormal   Collection Time: 04/22/22  3:43 AM  Result Value Ref Range   Sodium 135 135 - 145 mmol/L    Potassium 3.2 (L) 3.5 - 5.1 mmol/L   Chloride 102 98 - 111 mmol/L   CO2 25 22 - 32 mmol/L   Glucose, Bld 97 70 - 99 mg/dL    Comment: Glucose reference range applies only to samples taken after fasting for at least 8 hours.   BUN 7 6 - 20 mg/dL   Creatinine, Ser 0.70 0.44 - 1.00 mg/dL   Calcium 8.5 (L) 8.9 - 10.3 mg/dL   GFR, Estimated >60 >60 mL/min    Comment: (NOTE) Calculated using the CKD-EPI Creatinine Equation (2021)    Anion gap 8 5 - 15    Comment: Performed at [facility], [place] [address] [place], [place] [address]  Magnesium     Status: None   Collection Time: 04/22/22  3:43 AM  Result Value Ref Range   Magnesium 2.2 1.7 - 2.4 mg/dL    Comment: Performed at [facility], [place] [address] [place], [place] [address]  CBC     Status: Abnormal   Collection Time: 04/22/22  3:43 AM  Result Value Ref Range   WBC 10.3 4.0 - 10.5 K/uL   RBC 3.43 (L) 3.87 - 5.11 MIL/uL   Hemoglobin 9.8 (L) 12.0 - 15.0 g/dL   HCT 29.9 (L) 36.0 - 46.0 %   MCV 87.2 80.0 - 100.0 fL   MCH 28.6 26.0 - 34.0 pg   MCHC 32.8 30.0 - 36.0 g/dL   RDW 15.6 (H) 11.5 - 15.5 %   Platelets 312 150 - 400 K/uL   nRBC 0.0 0.0 - 0.2 %    Comment: Performed at [facility], [place] [address] [place], [place] [address]  Cortisol     Status: None   Collection Time: 04/22/22  3:43 AM  Result Value Ref Range   Cortisol, Plasma 12.6 ug/dL    Comment: (NOTE) AM    6.7 - 22.6 ug/dL PM   <10.0       ug/dL Performed at [place] [address], [place], [place] [address]   CK     Status: None   Collection Time: 04/22/22  3:43 AM  Result Value Ref Range   Total CK 163 38 - 234 U/L    Comment: Performed at [facility], [place] [address] [place], [place] [address]  Folate     Status: None   Collection Time: 04/22/22 10:32 AM  Result Value Ref Range   Folate 10.5 >5.9 ng/mL    Comment: Performed at [facility],  [place] [address], [place], [place] [address]    Current Facility-Administered Medications  Medication Dose Route Frequency Provider Last Rate Last Admin   0.9 %  sodium chloride infusion   Intravenous Continuous Orma Flaming, MD 75 mL/hr at 04/22/22 1053 New Bag at 04/22/22 1053   acetaminophen (TYLENOL) tablet 650 mg  650 mg Oral Q6H PRN Orma Flaming, MD       Or   acetaminophen (TYLENOL) suppository 650 mg  650 mg Rectal Q6H PRN Orma Flaming, MD       azithromycin [facility]) 500 mg in sodium chloride 0.9 % 250 mL IVPB  500 mg Intravenous Q24H Amin,  Jeanella Flattery, MD 250 mL/hr at 04/21/22 2012 500 mg at 04/21/22 2012   cefTRIAXone (ROCEPHIN) 1 g in sodium chloride 0.9 % 100 mL IVPB  1 g Intravenous Q24H Amin, Ankit Chirag, MD 200 mL/hr at 04/21/22 1706 1 g at 04/21/22 1706   enoxaparin (LOVENOX) injection 40 mg  40 mg Subcutaneous Q24H Orma Flaming, MD   40 mg at 04/20/22 2208   ferrous sulfate tablet 325 mg  325 mg Oral Q breakfast Amin, Ankit Chirag, MD   325 mg at 04/22/22 0902   guaiFENesin-dextromethorphan (ROBITUSSIN DM) 100-10 MG/5ML syrup 5 mL  5 mL Oral Q4H PRN Orma Flaming, MD       hydrALAZINE (APRESOLINE) injection 10 mg  10 mg Intravenous Q4H PRN Amin, Ankit Chirag, MD       indapamide (LOZOL) tablet 1.25 mg  1.25 mg Oral Daily Orma Flaming, MD   1.25 mg at 04/22/22 [id number]   ipratropium-albuterol (DUONEB) 0.5-2.5 (3) MG/3ML nebulizer solution 3 mL  3 mL Nebulization Q4H PRN Amin, Ankit Chirag, MD       metoprolol tartrate (LOPRESSOR) injection 5 mg  5 mg Intravenous Q4H PRN Amin, Ankit Chirag, MD       nebivolol (BYSTOLIC) tablet 10 mg  10 mg Oral Daily Orma Flaming, MD   10 mg at 04/22/22 1524   ondansetron (ZOFRAN) tablet 4 mg  4 mg Oral Q6H PRN Orma Flaming, MD       Or   ondansetron [facility]) injection 4 mg  4 mg Intravenous Q6H PRN Orma Flaming, MD       Oral care mouth rinse  15 mL Mouth Rinse PRN Orma Flaming, MD       senna-docusate (Senokot-S) tablet 1  tablet  1 tablet Oral QHS PRN Amin, Jeanella Flattery, MD       thiamine (VITAMIN B1) 500 mg in sodium chloride 0.9 % 50 mL IVPB  500 mg Intravenous Q24H Amin, Ankit Chirag, MD 100 mL/hr at 04/22/22 1530 500 mg at 04/22/22 1530   traZODone (DESYREL) tablet 50 mg  50 mg Oral QHS PRN Damita Lack, MD   50 mg at 04/21/22 2108   trimethoprim-polymyxin b (POLYTRIM) ophthalmic solution 2 drop  2 drop Right Eye Q4H Orma Flaming, MD   2 drop at 04/22/22 1523    Musculoskeletal: Strength & Muscle Tone: within normal limits Gait & Station: normal Patient leans: N/A            Psychiatric Specialty Exam:  Presentation  General Appearance: Casual; Fairly Groomed  Eye Contact:Good  Speech:Clear and Coherent; Normal Rate  Speech Volume:Normal  Handedness:Right   Mood and Affect  Mood:Euthymic  Affect:Appropriate   Thought Process  Thought Processes:Coherent  Descriptions of Associations:Circumstantial  Orientation:Full (Time, Place and Person)  Thought Content:Logical  History of Schizophrenia/Schizoaffective disorder:No data recorded Duration of Psychotic Symptoms:No data recorded Hallucinations:Hallucinations: None  Ideas of Reference:None  Suicidal Thoughts:Suicidal Thoughts: No  Homicidal Thoughts:Homicidal Thoughts: No   Sensorium  Memory:Immediate Good; Recent Good; Remote Good  Judgment:Good  Insight:Fair   Executive Functions  Concentration:Good  Attention Span:Good  [place] of Knowledge:Good  Language:Good   Psychomotor Activity  Psychomotor Activity:Psychomotor Activity: Mannerisms   Assets  Assets:Communication Skills; Financial Resources/Insurance; Resilience; Social Support   Sleep  Sleep:Sleep: Fair   Physical Exam: Physical Exam Vitals and nursing note reviewed.  Constitutional:      Appearance: Normal appearance. She is normal weight.  Neurological:     General: No focal deficit present.  Mental  Status: She is alert and oriented to person, place, and time. Mental status is at baseline.  Psychiatric:        Mood and Affect: Mood normal.        Behavior: Behavior normal.        Thought Content: Thought content normal.        Judgment: Judgment normal.    Review of Systems  Gastrointestinal: Negative.   Psychiatric/Behavioral: Negative.     Blood pressure 128/79, pulse 83, temperature 98.3 F (36.8 C), temperature source Oral, resp. rate (!) 22, height 5' 2"$  (1.575 m), weight 59.4 kg, SpO2 97 %. Body mass index is 23.95 kg/m.  Treatment Plan Summary: Plan    -Initial organic workup remains unremarkable. 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 Will recommend follow CTA outpatient. In addition to screening and preventive measures for mild cognitive impairment.  -No evidence of confusion or psychosis noted. Will not recommend any medications at this time. Patient has seemed to benefit from EAP, consider regular therapy sessions during high peak season.  -Please allow several days from infection (sepsis) to resolve prior to considering psychogenic. Patient with recent infection that can contribute to delirium. -Consider additional studies for anemia. Hemoglobin 13.1 (04/2021)> 9.8 (04/2022). No signs of bleeding present.   Labs reviewed TSH 1.267 and B12 689. Added on B1 and Folate.   Psychiatry to sign off at this time.   Disposition: No evidence of imminent risk to self or others at present.   Patient does not meet criteria for psychiatric inpatient admission. Supportive therapy provided about ongoing stressors.  Suella Broad, FNP 04/22/2022 3:35 PM

## 2022-04-22 NOTE — Progress Notes (Signed)
PROGRESS NOTE    Tracy Walton  [id number] DOB: 04-Dec-1965 DOA: 04/20/2022 PCP: Janith Lima, MD   Brief Narrative:  57 y.o. female with medical history significant of HTN who presented to ED after being seen at [facility] and found to be confused with concern for possible sepsis with recent viral infection vs. Electrolyte issue with her excessive water drinking.  She was found to have altered mental status and community-acquired pneumonia.  She was started on IV Rocephin and azithromycin.  CT of the head was negative.  Routine metabolic workup is negative, CT head, MRI brain also unremarkable.  Started on empiric IV thiamine.  Thiamine levels are pending.  Psychiatry consulted   Assessment & Plan:  Principal Problem:   Sepsis due to pneumonia [facility]) Active Problems:   Acute encephalopathy   Hypokalemia   Conjunctivitis   Normocytic anemia   Essential hypertension   Microscopic hematuria   Sepsis ([place])     Assessment and Plan: Left lower lobe consolidation Although procalcitonin level is negative and BNP is normal she did have leukocytosis and chest x-ray and CT evidence of left lower lobe consolidation concerning for pneumonia.  Will treated with Rocephin and azithromycin given fluctuating mentation. - Strep pneumo antigen, viral panel, COVID/flu/RSV all negative - Chest x-ray and CT scan consistent with left lower lobe dense consolidation  Acute encephalopathy -unclear why.  CT of the head is negative.  No focal neurodeficits.  UDS, UA, TSH, ammonia are all unremarkable.  MRI brain is unremarkable.  A.m. cortisol normal.  B1 level is pending.  Started on high-dose thiamine 2/18. -EEG ordered.  Discussed with Dr. Leonel Ramsay [facility] if she has psychogenic component to her issues.  Will consult psychiatry  Occasional dysphagia - Wonder if this is related to recent laryngitis.  Performed inconsistent with speech and swallow but overall she was doing well.  Okay with regular diet with  thin liquids  Hypokalemia As needed repletion  Conjunctivitis Does not wear contact lens Started on polytrim, will continue   Normocytic anemia Low iron saturations, supplements  Microscopic hematuria No signs of infection but positive dipstick.  Check CK  Essential hypertension Currently on Lozol and Bystolic.  IV as needed       DVT prophylaxis: Lovenox Code Status: Full code Family Communication: Mother at bedside  Continue hospital stay due to abnormal mental status   Subjective: Seen and examined at bedside, no complaints.  Patient appears comfortable but spitting up some spit.  Mother is present at bedside  Examination: Constitutional: Not in acute distress Respiratory: Clear to auscultation bilaterally Cardiovascular: Normal sinus rhythm, no rubs Abdomen: Nontender nondistended good bowel sounds Musculoskeletal: No edema noted Skin: No rashes seen Neurologic: CN 2-12 grossly intact.  And nonfocal.  At times still having stuttering speech Psychiatric: Alert to name and.  Very poor judgment and insight.  Tangential thoughts.  Keeps repeating sometimes  Objective: Vitals:   04/21/22 1457 04/21/22 2204 04/21/22 2206 04/22/22 0512  BP: 126/74 126/84 126/84 131/75  Pulse: 81 83 83 84  Resp: 17     Temp: 98.4 F (36.9 C) 99.1 F (37.3 C) 99.1 F (37.3 C)   TempSrc: Oral Oral Oral   SpO2: 98% 98%  100%  Weight:      Height:        Intake/Output Summary (Last 24 hours) at 04/22/2022 0741 Last data filed at 04/22/2022 [id number] Gross per 24 hour  Intake 2154.43 ml  Output 2151 ml  Net 3.43 ml  Filed Weights   04/20/22 2203  Weight: 59.4 kg     Data Reviewed:   CBC: Recent Labs  Lab 04/20/22 1550 04/21/22 0338 04/22/22 0343  WBC 14.2* 16.2* 10.3  NEUTROABS 10.8*  --   --   HGB 10.7* 10.8* 9.8*  HCT 32.7* 33.1* 29.9*  MCV 86.7 85.5 87.2  PLT 341 330 [id number]   Basic Metabolic Panel: Recent Labs  Lab 04/20/22 1550 04/21/22 0338 04/22/22 0343   NA 133* 136 135  K 2.6* 3.5 3.2*  CL 88* 100 102  CO2 31 25 25  $ GLUCOSE 110* 100* 97  BUN 14 9 7  $ CREATININE 0.90 0.78 0.70  CALCIUM 9.1 8.6* 8.5*  MG 2.2  --  2.2   GFR: Estimated Creatinine Clearance: 62.8 mL/min (by C-G formula based on SCr of 0.7 mg/dL). Liver Function Tests: Recent Labs  Lab 04/20/22 1550  AST 37  ALT 53*  ALKPHOS 60  BILITOT 0.8  PROT 7.9  ALBUMIN 3.3*   No results for input(s): "LIPASE", "AMYLASE" in the last 168 hours. Recent Labs  Lab 04/20/22 1841  AMMONIA 11   Coagulation Profile: No results for input(s): "INR", "PROTIME" in the last 168 hours. Cardiac Enzymes: No results for input(s): "CKTOTAL", "CKMB", "CKMBINDEX", "TROPONINI" in the last 168 hours. BNP (last 3 results) No results for input(s): "PROBNP" in the last 8760 hours. HbA1C: No results for input(s): "HGBA1C" in the last 72 hours. CBG: Recent Labs  Lab 04/20/22 1715  GLUCAP 120*   Lipid Profile: No results for input(s): "CHOL", "HDL", "LDLCALC", "TRIG", "CHOLHDL", "LDLDIRECT" in the last 72 hours. Thyroid Function Tests: Recent Labs    04/20/22 1935  TSH 1.267   Anemia Panel: Recent Labs    04/20/22 1935  VITAMINB12 689  FOLATE 14.4  FERRITIN 334*  TIBC 223*  IRON 23*   Sepsis Labs: Recent Labs  Lab 04/20/22 1550 04/21/22 0338 04/22/22 0343  PROCALCITON <0.10 <0.10 <0.10  LATICACIDVEN 1.4  --   --     Recent Results (from the past 240 hour(s))  Blood culture (routine x 2)     Status: None (Preliminary result)   Collection Time: 04/20/22  6:41 PM   Specimen: BLOOD  Result Value Ref Range Status   Specimen Description BLOOD LEFT ANTECUBITAL  Final   Special Requests   Final    BOTTLES DRAWN AEROBIC AND ANAEROBIC Blood Culture results may not be optimal due to an inadequate volume of blood received in culture bottles   Culture   Final    NO GROWTH < 12 HOURS Performed at [place] [address], [place], [place] [address]    Report  Status PENDING  Incomplete  Respiratory (~20 pathogens) panel by PCR     Status: None   Collection Time: 04/20/22  8:22 PM   Specimen: Nasopharyngeal Swab; Respiratory  Result Value Ref Range Status   Adenovirus NOT DETECTED NOT DETECTED Final   Coronavirus 229E NOT DETECTED NOT DETECTED Final    Comment: (NOTE) The Coronavirus on the Respiratory Panel, DOES NOT test for the novel  Coronavirus (2019 nCoV)    Coronavirus HKU1 NOT DETECTED NOT DETECTED Final   Coronavirus NL63 NOT DETECTED NOT DETECTED Final   Coronavirus OC43 NOT DETECTED NOT DETECTED Final   Metapneumovirus NOT DETECTED NOT DETECTED Final   Rhinovirus / Enterovirus NOT DETECTED NOT DETECTED Final   Influenza A NOT DETECTED NOT DETECTED Final   Influenza B NOT DETECTED NOT DETECTED Final  Parainfluenza Virus 1 NOT DETECTED NOT DETECTED Final   Parainfluenza Virus 2 NOT DETECTED NOT DETECTED Final   Parainfluenza Virus 3 NOT DETECTED NOT DETECTED Final   Parainfluenza Virus 4 NOT DETECTED NOT DETECTED Final   Respiratory Syncytial Virus NOT DETECTED NOT DETECTED Final   Bordetella pertussis NOT DETECTED NOT DETECTED Final   Bordetella Parapertussis NOT DETECTED NOT DETECTED Final   Chlamydophila pneumoniae NOT DETECTED NOT DETECTED Final   Mycoplasma pneumoniae NOT DETECTED NOT DETECTED Final    Comment: Performed at [facility], [place] [address] [place],  [address]  Resp panel by RT-PCR (RSV, Flu A&B, Covid) Nasopharyngeal Swab     Status: None   Collection Time: 04/20/22  8:22 PM   Specimen: Nasopharyngeal Swab; Nasal Swab  Result Value Ref Range Status   SARS Coronavirus 2 by RT PCR NEGATIVE NEGATIVE Final    Comment: (NOTE) SARS-CoV-2 target nucleic acids are NOT DETECTED.  The SARS-CoV-2 RNA is generally detectable in upper respiratory specimens during the acute phase of infection. The lowest concentration of SARS-CoV-2 viral copies this assay can detect is 138 copies/mL. A negative result does  not preclude SARS-Cov-2 infection and should not be used as the sole basis for treatment or other patient management decisions. A negative result may occur with  improper specimen collection/handling, submission of specimen other than nasopharyngeal swab, presence of viral mutation(s) within the areas targeted by this assay, and inadequate number of viral copies(<138 copies/mL). A negative result must be combined with clinical observations, patient history, and epidemiological information. The expected result is Negative.  Fact Sheet for Patients:  EntrepreneurPulse.com.au  Fact Sheet for Healthcare Providers:  IncredibleEmployment.be  This test is no t yet approved or cleared by the [place] FDA and  has been authorized for detection and/or diagnosis of SARS-CoV-2 by FDA under an Emergency Use Authorization (EUA). This EUA will remain  in effect (meaning this test can be used) for the duration of the COVID-19 declaration under Section 564(b)(1) of the Act, 21 U.S.C.section 360bbb-3(b)(1), unless the authorization is terminated  or revoked sooner.       Influenza A by PCR NEGATIVE NEGATIVE Final   Influenza B by PCR NEGATIVE NEGATIVE Final    Comment: (NOTE) The Xpert Xpress SARS-CoV-2/FLU/RSV plus assay is intended as an aid in the diagnosis of influenza from Nasopharyngeal swab specimens and should not be used as a sole basis for treatment. Nasal washings and aspirates are unacceptable for Xpert Xpress SARS-CoV-2/FLU/RSV testing.  Fact Sheet for Patients: EntrepreneurPulse.com.au  Fact Sheet for Healthcare Providers: IncredibleEmployment.be  This test is not yet approved or cleared by the [place] FDA and has been authorized for detection and/or diagnosis of SARS-CoV-2 by FDA under an Emergency Use Authorization (EUA). This EUA will remain in effect (meaning this test can be used) for the  duration of the COVID-19 declaration under Section 564(b)(1) of the Act, 21 U.S.C. section 360bbb-3(b)(1), unless the authorization is terminated or revoked.     Resp Syncytial Virus by PCR NEGATIVE NEGATIVE Final    Comment: (NOTE) Fact Sheet for Patients: EntrepreneurPulse.com.au  Fact Sheet for Healthcare Providers: IncredibleEmployment.be  This test is not yet approved or cleared by the [place] FDA and has been authorized for detection and/or diagnosis of SARS-CoV-2 by FDA under an Emergency Use Authorization (EUA). This EUA will remain in effect (meaning this test can be used) for the duration of the COVID-19 declaration under Section 564(b)(1) of the Act, 21 U.S.C. section 360bbb-3(b)(1),  unless the authorization is terminated or revoked.  Performed at [facility], [place] [address], [place], [place] [address]   Blood culture (routine x 2)     Status: None (Preliminary result)   Collection Time: 04/20/22  9:55 PM   Specimen: BLOOD RIGHT HAND  Result Value Ref Range Status   Specimen Description   Final    BLOOD RIGHT HAND Performed at [facility], [place] [address], [place], [place] [address]    Special Requests   Final    BOTTLES DRAWN AEROBIC ONLY Blood Culture adequate volume Performed at [facility] [address] [place], [place] [address]    Culture   Final    NO GROWTH < 12 HOURS Performed at [facility] [address] [place], [place] [address]    Report Status PENDING  Incomplete         Radiology Studies: CT CHEST WO CONTRAST  Result Date: 04/21/2022 CLINICAL DATA:  Dyspnea. EXAM: CT CHEST WITHOUT CONTRAST TECHNIQUE: Multidetector CT imaging of the chest was performed following the standard protocol without IV contrast. RADIATION DOSE REDUCTION: This exam was performed according to the departmental dose-optimization program which includes automated exposure  control, adjustment of the mA and/or kV according to patient size and/or use of iterative reconstruction technique. COMPARISON:  Chest radiograph, 04/20/2022. FINDINGS: Cardiovascular: Heart normal in size and configuration. No coronary artery calcifications. No pericardial effusion. Normal great vessels. Mediastinum/Nodes: No enlarged mediastinal or axillary lymph nodes. Thyroid gland, trachea, and esophagus demonstrate no significant findings. Lungs/Pleura: Dense consolidation throughout the inferior left lower lobe. Remainder of the lungs is clear. No pleural effusion. No pneumothorax. Upper Abdomen: Unremarkable. Musculoskeletal: No fracture or acute finding. No bone lesion. No chest wall mass. IMPRESSION: 1. Left lower lobe pneumonia. 2. No other acute abnormality. Electronically Signed   By: Lajean Manes M.D.   On: 04/21/2022 12:47   MR BRAIN WO CONTRAST  Result Date: 04/21/2022 CLINICAL DATA:  Mental status change with unknown cause EXAM: MRI HEAD WITHOUT CONTRAST TECHNIQUE: Multiplanar, multiecho pulse sequences of the brain and surrounding structures were obtained without intravenous contrast. COMPARISON:  None Available. FINDINGS: Brain: No acute infarction, hemorrhage, hydrocephalus, extra-axial collection or mass lesion. Mild FLAIR hyperintensity in the cerebral white matter with nonspecific pattern. Brain volume is normal. Vascular: The right ICA is small compared to the left without clear cause at the level of the circle-of-Willis, although limited slices. Skull and upper cervical spine: Normal marrow signal Sinuses/Orbits: Unremarkable IMPRESSION: 1. No acute intracranial finding. 2. Mild white matter disease without specific pattern. 3. Comparatively small right ICA in the upper neck and skull base, suggest CTA. Electronically Signed   By: Jorje Guild M.D.   On: 04/21/2022 12:46   CT Head Wo Contrast  Result Date: 04/20/2022 CLINICAL DATA:  57 year old female with history of altered  mental status. EXAM: CT HEAD WITHOUT CONTRAST TECHNIQUE: Contiguous axial images were obtained from the base of the skull through the vertex without intravenous contrast. RADIATION DOSE REDUCTION: This exam was performed according to the departmental dose-optimization program which includes automated exposure control, adjustment of the mA and/or kV according to patient size and/or use of iterative reconstruction technique. COMPARISON:  No priors. FINDINGS: Brain: No evidence of acute infarction, hemorrhage, hydrocephalus, extra-axial collection or mass lesion/mass effect. Vascular: No hyperdense vessel or unexpected calcification. Skull: Normal. Negative for fracture or focal lesion. Sinuses/Orbits: No acute finding. Other: None. IMPRESSION: 1. No acute intracranial abnormalities. The  appearance of the brain is normal. Electronically Signed   By: Vinnie Langton M.D.   On: 04/20/2022 16:44   DG Chest 2 View  Result Date: 04/20/2022 CLINICAL DATA:  Cough, altered level of consciousness, possible sepsis EXAM: CHEST - 2 VIEW COMPARISON:  None Available. FINDINGS: Frontal and lateral views of the chest demonstrate an unremarkable cardiac silhouette. Dense left lower lobe airspace disease consistent with pneumonia. Trace left parapneumonic effusion. Right chest is clear. No pneumothorax. IMPRESSION: 1. Dense left lower lobe pneumonia, with trace left parapneumonic effusion. Electronically Signed   By: Randa Ngo M.D.   On: 04/20/2022 16:22        Scheduled Meds:  enoxaparin (LOVENOX) injection  40 mg Subcutaneous Q24H   ferrous sulfate  325 mg Oral Q breakfast   indapamide  1.25 mg Oral Daily   nebivolol  10 mg Oral Daily   potassium chloride  40 mEq Oral Once   trimethoprim-polymyxin b  2 drop Right Eye Q4H   Continuous Infusions:  sodium chloride 75 mL/hr at 04/21/22 2300   azithromycin 500 mg (04/21/22 2012)   cefTRIAXone (ROCEPHIN)  IV 1 g (04/21/22 1706)   potassium chloride     thiamine  (VITAMIN B1) injection 500 mg (04/21/22 1347)     LOS: 1 day   Time spent= 35 mins    Shawnta Schlegel Arsenio Loader, MD [facility]  If 7PM-7AM, please contact night-coverage  04/22/2022, 7:41 AM

## 2022-04-22 NOTE — Procedures (Signed)
Patient Name: Tracy Walton  MRN: [id number]  Epilepsy Attending: Lora Havens  Referring Physician/Provider: Damita Lack, MD  Date: 04/22/2022 Duration: 21.35 mins  Patient history: 57yo F with ams. EEG to evaluate for seizure  Level of alertness: Awake  AEDs during EEG study: None  Technical aspects: This EEG study was done with scalp electrodes positioned according to the 10-20 International system of electrode placement. Electrical activity was reviewed with band pass filter of 1-70Hz$ , sensitivity of 7 uV/mm, display speed of 14m/sec with a 60Hz$  notched filter applied as appropriate. EEG data were recorded continuously and digitally stored.  Video monitoring was available and reviewed as appropriate.  Description: The posterior dominant rhythm consists of 10-11 Hz activity of moderate voltage (25-35 uV) seen predominantly in posterior head regions, symmetric and reactive to eye opening and eye closing. EEG showed intermittent generalized high amplitude sharply contoured 6 to 7 Hz theta slowing. Hyperventilation and photic stimulation were not performed.     ABNORMALITY - Intermittent slow, generalized  IMPRESSION: This study is suggestive of mild diffuse encephalopathy, nonspecific etiology. No seizures or epileptiform discharges were seen throughout the recording.  [name]

## 2022-04-22 NOTE — Progress Notes (Signed)
EEG complete - results pending 

## 2022-04-22 NOTE — Progress Notes (Signed)
Mobility Specialist - Progress Note   04/22/22 1330  Mobility  Activity Ambulated independently in hallway  Level of Assistance Standby assist, set-up cues, supervision of patient - no hands on  Assistive Device None  Distance Ambulated (ft) 350 ft  Range of Motion/Exercises Active  Activity Response Tolerated well  $Mobility charge 1 Mobility   Pt was found on recliner chair and agreeable to ambulate. Stated being fearful of walking a fast pace during ambulation and recommended to slow down to ease her. At EOS returned to recliner chair and was curious about any leg exercises she could do. Pt was given and demonstrated a list of exercises she could do to mobilize her legs while sitting on the recliner chair. Was left on recliner chair with necessities and mother in room. RN notified of session.  Ferd Hibbs Mobility Specialist

## 2022-04-22 NOTE — Progress Notes (Signed)
  Transition of Care [facility]) Screening Note   Patient Details  Name: Tracy Walton Date of Birth: October 08, 1965   Transition of [facility]) CM/SW Contact:    Dessa Phi, RN Phone Number: 04/22/2022, 12:32 PM    Transition of Care [facility]) has reviewed patient and no TOC needs have been identified at this time. We will continue to monitor patient advancement through interdisciplinary progression rounds. If new patient transition needs arise, please place a TOC consult.

## 2022-04-22 NOTE — Progress Notes (Signed)
Speech Language Pathology Treatment: Dysphagia  Patient Details Name: Tracy Walton MRN: [id number] DOB: [birth year] Today's Date: 04/22/2022 Time: [id number] SLP Time Calculation (min) (ACUTE ONLY): 15 min  Assessment / Plan / Recommendation Clinical Impression  Patient seen by SLP for skilled treatment focused on dysphagia goals. Her mother was in the room as well. Patient sitting up in recliner and had lunch tray in front of her but it did not appear that she had eaten much at all. Her voice is mildly hoarse from recently having laryngitis following a cold. Patient herself was more concerned about her voice and although she did have a couple instances of grimacing when drinking water, no overt s/s aspiration or penetration observed and swallow appeared timely. SLP provided patient and mother with some education regarding her voice with recommendation for vocal hygiene (limit loud talking/yelling, limit coughing, drink water, use non-medicated lozenges if needed for throat). Per mother, patient had lost her voice from 2/10 to 04/19/22. SLP also recommended to patient and mother that if she is still having any voice issues in about two weeks from now, she could consult with her PCP. SLP to s/o at this time.    HPI HPI: 57 y.o. female with medical history significant of HTN who presented to ED after being seen at [facility] and found to be confused with concern for possible sepsis with recent viral infection vs. Electrolyte issue with her excessive water drinking.  She was found to have altered mental status and community-acquired pneumonia.  She was started on IV Rocephin and azithromycin.  CT of the head was negative.      SLP Plan  Discharge SLP treatment due to (comment);All goals met      Recommendations for follow up therapy are one component of a multi-disciplinary discharge planning process, led by the attending physician.  Recommendations may be updated based on patient status, additional functional  criteria and insurance authorization.    Recommendations  Diet recommendations: Regular;Thin liquid Liquids provided via: Cup;Straw Medication Administration: Whole meds with liquid Supervision: Patient able to self feed Postural Changes and/or Swallow Maneuvers: Seated upright 90 degrees                Oral Care Recommendations: Oral care BID Follow Up Recommendations: No SLP follow up Assistance recommended at discharge: None SLP Visit Diagnosis: Dysphagia, unspecified (R13.10) Plan: Discharge SLP treatment due to (comment);All goals met           Sonia Baller, MA, CCC-SLP Speech Therapy

## 2022-04-23 LAB — BASIC METABOLIC PANEL
Anion gap: 10 (ref 5–15)
BUN: 10 mg/dL (ref 6–20)
CO2: 25 mmol/L (ref 22–32)
Calcium: 8.9 mg/dL (ref 8.9–10.3)
Chloride: 101 mmol/L (ref 98–111)
Creatinine, Ser: 0.79 mg/dL (ref 0.44–1.00)
GFR, Estimated: >60 mL/min (ref >60)
Glucose, Bld: 96 mg/dL (ref 70–99)
Potassium: 3.5 mmol/L (ref 3.5–5.1)
Sodium: 136 mmol/L (ref 135–145)

## 2022-04-23 LAB — CBC
HCT: 30.9 % — ABNORMAL LOW (ref 36.0–46.0)
Hemoglobin: 9.9 g/dL — ABNORMAL LOW (ref 12.0–15.0)
MCH: 27.9 pg (ref 26.0–34.0)
MCHC: 32 g/dL (ref 30.0–36.0)
MCV: 87 fL (ref 80.0–100.0)
Platelets: 351 10*3/uL (ref 150–400)
RBC: 3.55 MIL/uL — ABNORMAL LOW (ref 3.87–5.11)
RDW: 15.8 % — ABNORMAL HIGH (ref 11.5–15.5)
WBC: 9.5 10*3/uL (ref 4.0–10.5)
nRBC: 0 % (ref 0.0–0.2)

## 2022-04-23 LAB — MAGNESIUM: Magnesium: 2.2 mg/dL (ref 1.7–2.4)

## 2022-04-23 MED ORDER — POTASSIUM CHLORIDE CRYS ER 20 MEQ PO TBCR
40.0000 meq | EXTENDED_RELEASE_TABLET | Freq: Once | ORAL | Status: AC
  Administered 2022-04-23: 40 meq via ORAL
  Filled 2022-04-23: qty 2

## 2022-04-23 NOTE — Progress Notes (Signed)
PROGRESS NOTE    Tracy Walton  [id number] DOB: [birth year] DOA: 04/20/2022 PCP: Janith Lima, MD   Brief Narrative:  57 y.o. female with medical history significant of HTN who presented to ED after being seen at [facility] and found to be confused with concern for possible sepsis with recent viral infection vs. Electrolyte issue with her excessive water drinking.  She was found to have altered mental status and community-acquired pneumonia.  She was started on IV Rocephin and azithromycin.  CT of the head was negative.  Routine metabolic workup is negative, CT head, MRI brain also unremarkable.  Started on empiric IV thiamine.  Thiamine levels are pending.  Psychiatry consulted and patient does not meet inpatient criteria.  EEG is negative.   Assessment & Plan:  Principal Problem:   Sepsis due to pneumonia [facility]) Active Problems:   Acute encephalopathy   Hypokalemia   Conjunctivitis   Normocytic anemia   Essential hypertension   Microscopic hematuria   Sepsis ([place])     Assessment and Plan: Left lower lobe consolidation Although procalcitonin level is negative and BNP is normal she did have leukocytosis and chest x-ray and CT evidence of left lower lobe consolidation concerning for pneumonia.  Complete 5-day course of Rocephin and azithromycin - Strep pneumo antigen, viral panel, COVID/flu/RSV all negative - Chest x-ray and CT scan consistent with left lower lobe dense consolidation  Acute encephalopathy -unclear why.  CT of the head is negative.  No focal neurodeficits.  UDS, UA, TSH, ammonia are all unremarkable.  MRI brain is unremarkable.  A.m. cortisol normal.  B1 level is pending.  Started on high-dose thiamine 2/18. -EEG unremarkable.  Discussed with Dr. Leonel Ramsay - Seen by psychiatry-recommends outpatient follow-up.  No indication to start on medications  Occasional dysphagia - Wonder if this is related to recent laryngitis.  Performed inconsistent with speech and swallow  but overall she was doing well.  Okay with regular diet with thin liquids  Hypokalemia As needed repletion  Conjunctivitis Does not wear contact lens Started on polytrim, will continue   Normocytic anemia Low iron saturations, supplements  Microscopic hematuria No signs of infection but positive dipstick.  Check CK  Essential hypertension Currently on Lozol and Bystolic.  IV as needed       DVT prophylaxis: Lovenox Code Status: Full code Family Communication:   Continue hospital stay due to abnormal mental status   Subjective: Mentation improved but overall still showing some signs of confusion.  Follows most of the commands but having trouble following detailed instructions.  Examination: Constitutional: Not in acute distress Respiratory: Clear to auscultation bilaterally Cardiovascular: Normal sinus rhythm, no rubs Abdomen: Nontender nondistended good bowel sounds Musculoskeletal: No edema noted Skin: No rashes seen Neurologic: CN 2-12 grossly intact.  And nonfocal Psychiatric: Poor judgment.  Alert to name place and time   Objective: Vitals:   04/22/22 0512 04/22/22 1254 04/22/22 2106 04/23/22 0418  BP: 131/75 128/79 118/76 (!) 146/83  Pulse: 84 83 92 91  Resp:  (!) 22 17 16  $ Temp:  98.3 F (36.8 C) 99.2 F (37.3 C) 99 F (37.2 C)  TempSrc:  Oral Oral Oral  SpO2: 100% 97% 98% 99%  Weight:      Height:        Intake/Output Summary (Last 24 hours) at 04/23/2022 1214 Last data filed at 04/23/2022 0901 Gross per 24 hour  Intake 1598.28 ml  Output 650 ml  Net 948.28 ml   Filed Weights   04/20/22  2203  Weight: 59.4 kg     Data Reviewed:   CBC: Recent Labs  Lab 04/20/22 1550 04/21/22 0338 04/22/22 0343 04/23/22 0414  WBC 14.2* 16.2* 10.3 9.5  NEUTROABS 10.8*  --   --   --   HGB 10.7* 10.8* 9.8* 9.9*  HCT 32.7* 33.1* 29.9* 30.9*  MCV 86.7 85.5 87.2 87.0  PLT 341 330 312 [id number]   Basic Metabolic Panel: Recent Labs  Lab 04/20/22 1550  04/21/22 0338 04/22/22 0343 04/23/22 0414  NA 133* 136 135 136  K 2.6* 3.5 3.2* 3.5  CL 88* 100 102 101  CO2 31 25 25 25  $ GLUCOSE 110* 100* 97 96  BUN 14 9 7 10  $ CREATININE 0.90 0.78 0.70 0.79  CALCIUM 9.1 8.6* 8.5* 8.9  MG 2.2  --  2.2 2.2   GFR: Estimated Creatinine Clearance: 62.8 mL/min (by C-G formula based on SCr of 0.79 mg/dL). Liver Function Tests: Recent Labs  Lab 04/20/22 1550  AST 37  ALT 53*  ALKPHOS 60  BILITOT 0.8  PROT 7.9  ALBUMIN 3.3*   No results for input(s): "LIPASE", "AMYLASE" in the last 168 hours. Recent Labs  Lab 04/20/22 1841  AMMONIA 11   Coagulation Profile: No results for input(s): "INR", "PROTIME" in the last 168 hours. Cardiac Enzymes: Recent Labs  Lab 04/22/22 0343  CKTOTAL 163   BNP (last 3 results) No results for input(s): "PROBNP" in the last 8760 hours. HbA1C: No results for input(s): "HGBA1C" in the last 72 hours. CBG: Recent Labs  Lab 04/20/22 1715  GLUCAP 120*   Lipid Profile: No results for input(s): "CHOL", "HDL", "LDLCALC", "TRIG", "CHOLHDL", "LDLDIRECT" in the last 72 hours. Thyroid Function Tests: Recent Labs    04/20/22 1935  TSH 1.267   Anemia Panel: Recent Labs    04/20/22 1935 04/22/22 1032  VITAMINB12 689  --   FOLATE 14.4 10.5  FERRITIN 334*  --   TIBC 223*  --   IRON 23*  --    Sepsis Labs: Recent Labs  Lab 04/20/22 1550 04/21/22 0338 04/22/22 0343  PROCALCITON <0.10 <0.10 <0.10  LATICACIDVEN 1.4  --   --     Recent Results (from the past 240 hour(s))  Blood culture (routine x 2)     Status: None (Preliminary result)   Collection Time: 04/20/22  6:41 PM   Specimen: BLOOD  Result Value Ref Range Status   Specimen Description BLOOD LEFT ANTECUBITAL  Final   Special Requests   Final    BOTTLES DRAWN AEROBIC AND ANAEROBIC Blood Culture results may not be optimal due to an inadequate volume of blood received in culture bottles   Culture   Final    NO GROWTH 2 DAYS Performed at [facility] [address] [place], [place] [address]    Report Status PENDING  Incomplete  Respiratory (~20 pathogens) panel by PCR     Status: None   Collection Time: 04/20/22  8:22 PM   Specimen: Nasopharyngeal Swab; Respiratory  Result Value Ref Range Status   Adenovirus NOT DETECTED NOT DETECTED Final   Coronavirus 229E NOT DETECTED NOT DETECTED Final    Comment: (NOTE) The Coronavirus on the Respiratory Panel, DOES NOT test for the novel  Coronavirus (2019 nCoV)    Coronavirus HKU1 NOT DETECTED NOT DETECTED Final   Coronavirus NL63 NOT DETECTED NOT DETECTED Final   Coronavirus OC43 NOT DETECTED NOT DETECTED Final   Metapneumovirus NOT DETECTED NOT DETECTED Final  Rhinovirus / Enterovirus NOT DETECTED NOT DETECTED Final   Influenza A NOT DETECTED NOT DETECTED Final   Influenza B NOT DETECTED NOT DETECTED Final   Parainfluenza Virus 1 NOT DETECTED NOT DETECTED Final   Parainfluenza Virus 2 NOT DETECTED NOT DETECTED Final   Parainfluenza Virus 3 NOT DETECTED NOT DETECTED Final   Parainfluenza Virus 4 NOT DETECTED NOT DETECTED Final   Respiratory Syncytial Virus NOT DETECTED NOT DETECTED Final   Bordetella pertussis NOT DETECTED NOT DETECTED Final   Bordetella Parapertussis NOT DETECTED NOT DETECTED Final   Chlamydophila pneumoniae NOT DETECTED NOT DETECTED Final   Mycoplasma pneumoniae NOT DETECTED NOT DETECTED Final    Comment: Performed at [facility], [place] [address], [place], [place] [address]  Resp panel by RT-PCR (RSV, Flu A&B, Covid) Nasopharyngeal Swab     Status: None   Collection Time: 04/20/22  8:22 PM   Specimen: Nasopharyngeal Swab; Nasal Swab  Result Value Ref Range Status   SARS Coronavirus 2 by RT PCR NEGATIVE NEGATIVE Final    Comment: (NOTE) SARS-CoV-2 target nucleic acids are NOT DETECTED.  The SARS-CoV-2 RNA is generally detectable in upper respiratory specimens during the acute phase of infection. The lowest concentration of SARS-CoV-2 viral  copies this assay can detect is 138 copies/mL. A negative result does not preclude SARS-Cov-2 infection and should not be used as the sole basis for treatment or other patient management decisions. A negative result may occur with  improper specimen collection/handling, submission of specimen other than nasopharyngeal swab, presence of viral mutation(s) within the areas targeted by this assay, and inadequate number of viral copies(<138 copies/mL). A negative result must be combined with clinical observations, patient history, and epidemiological information. The expected result is Negative.  Fact Sheet for Patients:  EntrepreneurPulse.com.au  Fact Sheet for Healthcare Providers:  IncredibleEmployment.be  This test is no t yet approved or cleared by the [place] FDA and  has been authorized for detection and/or diagnosis of SARS-CoV-2 by FDA under an Emergency Use Authorization (EUA). This EUA will remain  in effect (meaning this test can be used) for the duration of the COVID-19 declaration under Section 564(b)(1) of the Act, 21 U.S.C.section 360bbb-3(b)(1), unless the authorization is terminated  or revoked sooner.       Influenza A by PCR NEGATIVE NEGATIVE Final   Influenza B by PCR NEGATIVE NEGATIVE Final    Comment: (NOTE) The Xpert Xpress SARS-CoV-2/FLU/RSV plus assay is intended as an aid in the diagnosis of influenza from Nasopharyngeal swab specimens and should not be used as a sole basis for treatment. Nasal washings and aspirates are unacceptable for Xpert Xpress SARS-CoV-2/FLU/RSV testing.  Fact Sheet for Patients: EntrepreneurPulse.com.au  Fact Sheet for Healthcare Providers: IncredibleEmployment.be  This test is not yet approved or cleared by the [place] FDA and has been authorized for detection and/or diagnosis of SARS-CoV-2 by FDA under an Emergency Use Authorization (EUA). This  EUA will remain in effect (meaning this test can be used) for the duration of the COVID-19 declaration under Section 564(b)(1) of the Act, 21 U.S.C. section 360bbb-3(b)(1), unless the authorization is terminated or revoked.     Resp Syncytial Virus by PCR NEGATIVE NEGATIVE Final    Comment: (NOTE) Fact Sheet for Patients: EntrepreneurPulse.com.au  Fact Sheet for Healthcare Providers: IncredibleEmployment.be  This test is not yet approved or cleared by the [place] FDA and has been authorized for detection and/or diagnosis of SARS-CoV-2 by FDA under an Emergency Use Authorization (EUA). This  EUA will remain in effect (meaning this test can be used) for the duration of the COVID-19 declaration under Section 564(b)(1) of the Act, 21 U.S.C. section 360bbb-3(b)(1), unless the authorization is terminated or revoked.  Performed at [facility], [place] [address], [place], [place] [address]   Blood culture (routine x 2)     Status: None (Preliminary result)   Collection Time: 04/20/22  9:55 PM   Specimen: BLOOD RIGHT HAND  Result Value Ref Range Status   Specimen Description   Final    BLOOD RIGHT HAND Performed at [facility], [place] [address] [place], [place] [address]    Special Requests   Final    BOTTLES DRAWN AEROBIC ONLY Blood Culture adequate volume Performed at [facility] [address] [place], [place] [address]    Culture   Final    NO GROWTH 2 DAYS Performed at [facility] [address], [place], [place] [address]    Report Status PENDING  Incomplete  Urine Culture (for pregnant, neutropenic or urologic patients or patients with an indwelling urinary catheter)     Status: None   Collection Time: 04/20/22 11:15 PM   Specimen: Urine, Clean Catch  Result Value Ref Range Status   Specimen Description   Final    URINE, CLEAN CATCH Performed at [facility],  [place] [address] [place], [place] [address]    Special Requests   Final    NONE Performed at [facility], [place] [address] [place], [place] [address]    Culture   Final    NO GROWTH Performed at [facility], [place] [address] [place], [place] [address]    Report Status April 26, 2022 FINAL  Final         Radiology Studies: EEG adult  Result Date: 04-26-2022 Lora Havens, MD     2022-04-26  9:54 PM Patient Name: Tracy Walton MRN: [id number] Epilepsy Attending: Lora Havens Referring Physician/Provider: Damita Lack, MD Date: 04/26/2022 Duration: 21.35 mins Patient history: 57yo F with ams. EEG to evaluate for seizure Level of alertness: Awake AEDs during EEG study: None Technical aspects: This EEG study was done with scalp electrodes positioned according to the 10-20 International system of electrode placement. Electrical activity was reviewed with band pass filter of 1-70Hz$ , sensitivity of 7 uV/mm, display speed of 62m/sec with a 60Hz$  notched filter applied as appropriate. EEG data were recorded continuously and digitally stored.  Video monitoring was available and reviewed as appropriate. Description: The posterior dominant rhythm consists of 10-11 Hz activity of moderate voltage (25-35 uV) seen predominantly in posterior head regions, symmetric and reactive to eye opening and eye closing. EEG showed intermittent generalized high amplitude sharply contoured 6 to 7 Hz theta slowing. Hyperventilation and photic stimulation were not performed.   ABNORMALITY - Intermittent slow, generalized IMPRESSION: This study is suggestive of mild diffuse encephalopathy, nonspecific etiology. No seizures or epileptiform discharges were seen throughout the recording. PLora Havens  CT CHEST WO CONTRAST  Result Date: 04/21/2022 CLINICAL DATA:  Dyspnea. EXAM: CT CHEST WITHOUT CONTRAST TECHNIQUE: Multidetector CT imaging of the chest was performed following the standard  protocol without IV contrast. RADIATION DOSE REDUCTION: This exam was performed according to the departmental dose-optimization program which includes automated exposure control, adjustment of the mA and/or kV according to patient size and/or use of iterative reconstruction technique. COMPARISON:  Chest radiograph, 04/20/2022. FINDINGS: Cardiovascular: Heart normal in size  and configuration. No coronary artery calcifications. No pericardial effusion. Normal great vessels. Mediastinum/Nodes: No enlarged mediastinal or axillary lymph nodes. Thyroid gland, trachea, and esophagus demonstrate no significant findings. Lungs/Pleura: Dense consolidation throughout the inferior left lower lobe. Remainder of the lungs is clear. No pleural effusion. No pneumothorax. Upper Abdomen: Unremarkable. Musculoskeletal: No fracture or acute finding. No bone lesion. No chest wall mass. IMPRESSION: 1. Left lower lobe pneumonia. 2. No other acute abnormality. Electronically Signed   By: Lajean Manes M.D.   On: 04/21/2022 12:47   MR BRAIN WO CONTRAST  Result Date: 04/21/2022 CLINICAL DATA:  Mental status change with unknown cause EXAM: MRI HEAD WITHOUT CONTRAST TECHNIQUE: Multiplanar, multiecho pulse sequences of the brain and surrounding structures were obtained without intravenous contrast. COMPARISON:  None Available. FINDINGS: Brain: No acute infarction, hemorrhage, hydrocephalus, extra-axial collection or mass lesion. Mild FLAIR hyperintensity in the cerebral white matter with nonspecific pattern. Brain volume is normal. Vascular: The right ICA is small compared to the left without clear cause at the level of the circle[place], although limited slices. Skull and upper cervical spine: Normal marrow signal Sinuses/Orbits: Unremarkable IMPRESSION: 1. No acute intracranial finding. 2. Mild white matter disease without specific pattern. 3. Comparatively small right ICA in the upper neck and skull base, suggest CTA. Electronically  Signed   By: Jorje Guild M.D.   On: 04/21/2022 12:46        Scheduled Meds:  enoxaparin (LOVENOX) injection  40 mg Subcutaneous Q24H   ferrous sulfate  325 mg Oral Q breakfast   indapamide  1.25 mg Oral Daily   nebivolol  10 mg Oral Daily   trimethoprim-polymyxin b  2 drop Right Eye Q4H   Continuous Infusions:  sodium chloride 75 mL/hr at 04/23/22 0600   azithromycin Stopped (04/22/22 2357)   cefTRIAXone (ROCEPHIN)  IV Stopped (04/22/22 2213)   thiamine (VITAMIN B1) injection Stopped (04/22/22 1614)     LOS: 2 days   Time spent= 35 mins    Makana Rostad Arsenio Loader, MD [facility]  If 7PM-7AM, please contact night-coverage  04/23/2022, 12:14 PM

## 2022-04-23 NOTE — Progress Notes (Signed)
Mobility Specialist - Progress Note   04/23/22 0932  Mobility  Activity Ambulated independently in hallway  Level of Assistance Modified independent, requires aide device or extra time  Assistive Device None  Distance Ambulated (ft) 700 ft  Range of Motion/Exercises Active  Activity Response Tolerated well  $Mobility charge 1 Mobility   Pt was found on recliner chair requesting to ambulate. Stated feeling weak today due to having soft stool. Pt is independent with mobility, and at EOS returned to recliner chair with all necessities in reach. RN notified of session.  Ferd Hibbs Mobility Specialist

## 2022-04-23 NOTE — Plan of Care (Signed)
  Problem: Respiratory: Goal: Ability to maintain adequate ventilation will improve Outcome: Progressing   Problem: Clinical Measurements: Goal: Respiratory complications will improve Outcome: Progressing Goal: Cardiovascular complication will be avoided Outcome: Progressing   Problem: Activity: Goal: Risk for activity intolerance will decrease Outcome: Progressing

## 2022-04-24 LAB — CBC
HCT: 34 % — ABNORMAL LOW (ref 36.0–46.0)
Hemoglobin: 10.8 g/dL — ABNORMAL LOW (ref 12.0–15.0)
MCH: 28.1 pg (ref 26.0–34.0)
MCHC: 31.8 g/dL (ref 30.0–36.0)
MCV: 88.5 fL (ref 80.0–100.0)
Platelets: 389 10*3/uL (ref 150–400)
RBC: 3.84 MIL/uL — ABNORMAL LOW (ref 3.87–5.11)
RDW: 15.8 % — ABNORMAL HIGH (ref 11.5–15.5)
WBC: 8.3 10*3/uL (ref 4.0–10.5)
nRBC: 0 % (ref 0.0–0.2)

## 2022-04-24 LAB — BASIC METABOLIC PANEL
Anion gap: 10 (ref 5–15)
BUN: 7 mg/dL (ref 6–20)
CO2: 26 mmol/L (ref 22–32)
Calcium: 9.4 mg/dL (ref 8.9–10.3)
Chloride: 100 mmol/L (ref 98–111)
Creatinine, Ser: 0.76 mg/dL (ref 0.44–1.00)
GFR, Estimated: >60 mL/min (ref >60)
Glucose, Bld: 105 mg/dL — ABNORMAL HIGH (ref 70–99)
Potassium: 4.4 mmol/L (ref 3.5–5.1)
Sodium: 136 mmol/L (ref 135–145)

## 2022-04-24 LAB — LEGIONELLA PNEUMOPHILA SEROGP 1 UR AG: L. pneumophila Serogp 1 Ur Ag: NEGATIVE

## 2022-04-24 LAB — MAGNESIUM: Magnesium: 1.9 mg/dL (ref 1.7–2.4)

## 2022-04-24 MED ORDER — AMOXICILLIN-POT CLAVULANATE 875-125 MG PO TABS: 1.0000 | ORAL_TABLET | Freq: Two times a day (BID) | ORAL | 0 refills | Status: DC

## 2022-04-24 MED ORDER — DOCUSATE SODIUM 100 MG PO CAPS: 100.0000 mg | ORAL_CAPSULE | Freq: Every day | ORAL | 0 refills | Status: AC | PRN

## 2022-04-24 MED ORDER — THIAMINE HCL 100 MG PO TABS: 100.0000 mg | ORAL_TABLET | Freq: Every day | ORAL | 0 refills | Status: DC

## 2022-04-24 MED ORDER — FERROUS SULFATE 325 (65 FE) MG PO TABS: 325.0000 mg | ORAL_TABLET | Freq: Every day | ORAL | 0 refills | Status: DC

## 2022-04-24 NOTE — TOC Transition Note (Signed)
Transition of Care [facility]) - CM/SW Discharge Note   Patient Details  Name: Tracy Walton MRN: [id number] Date of Birth: 07-05-1965  Transition of Care [facility]) CM/SW Contact:  Henrietta Dine, RN Phone Number: 04/24/2022, 1:01 PM   Clinical Narrative:    TOC contacted b/c pt would like [place] and Aide; spoke w/ and mother in room; pt says she needs someone to wake her up in the morning and give her meds because she does not know how and when to take her medication; pt's mother also says pt needs a housekeeper; I[place], RN reinforced education on medication administration; pt able to complete ADLs independently; pt's mother will contact agency for housekeeping; Dr Reesa Chew notified; no TOC needs.   Final next level of care: Home/Self Care Barriers to Discharge: No Barriers Identified   Patient Goals and CMS Choice      Discharge Placement                         Discharge Plan and Services Additional resources added to the After Visit Summary for                                       Social Determinants of Health (SDOH) Interventions SDOH Screenings   Food Insecurity: No Food Insecurity (04/21/2022)  Housing: Low Risk  (04/21/2022)  Transportation Needs: No Transportation Needs (04/21/2022)  Utilities: Not At Risk (04/21/2022)  Depression (PHQ2-9): Low Risk  (04/15/2022)  Tobacco Use: Low Risk  (04/20/2022)     Readmission Risk Interventions     No data to display

## 2022-04-24 NOTE — Discharge Summary (Signed)
Physician Discharge Summary  Tracy Walton [id number] DOB: 1966-02-12 DOA: 04/20/2022  PCP: Janith Lima, MD  Admit date: 04/20/2022 Discharge date: 04/24/2022  Admitted From: Home Disposition: Home  Recommendations for Outpatient Follow-up:  Follow up with PCP in 1-2 weeks Please obtain BMP/CBC in one week your next doctors visit.  Psychiatry recommended outpatient EAP Thiamine levels pending upon discharge, supplements given Iron supplements with bowel regimen 1 more day of oral Augmentin    Discharge Condition: Stable CODE STATUS: Full code Diet recommendation: Low-salt  Brief/Interim Summary: 57 y.o. female with medical history significant of HTN who presented to ED after being seen at [facility] and found to be confused with concern for possible sepsis with recent viral infection vs. Electrolyte issue with her excessive water drinking.  She was found to have altered mental status and community-acquired pneumonia.  She was started on IV Rocephin and azithromycin.  CT of the head was negative.  Routine metabolic workup is negative, CT head, MRI brain also unremarkable.  Started on empiric IV thiamine.  Thiamine levels are pending.  Psychiatry consulted and patient does not meet inpatient criteria.  Psychiatry recommended outpatient EAP. EEG is negative.  Follow-up outpatient primary care physician.       Assessment and Plan: Left lower lobe consolidation Will give her 1 more day of oral Augmentin to complete total 5-day course of antibiotics.  CT scan was consistent with left lower lobe consolidation/pneumonia - Strep pneumo antigen, viral panel, COVID/flu/RSV all negative   Acute encephalopathy, improved to -unclear why.  CT of the head is negative.  No focal neurodeficits.  UDS, UA, TSH, ammonia are all unremarkable.  MRI brain is unremarkable.  A.m. cortisol normal.  B1 level is pending.  Started on high-dose thiamine 2/18.  Will transition to p.o. upon discharge.  This can be  followed up by PCP -EEG unremarkable.  Discussed with Dr. Leonel Ramsay - Seen by psychiatry-recommends outpatient follow-up.  No indication to start on medications   Occasional dysphagia - Wonder if this is related to recent laryngitis.  Performed inconsistent with speech and swallow but overall she was doing well.  Okay with regular diet with thin liquids   Hypokalemia As needed repletion   Conjunctivitis Does not wear contact lens Started on polytrim, will continue    Normocytic anemia Low iron saturations, supplements   Microscopic hematuria No signs of infection but positive dipstick.  Normal CK levels   Essential hypertension Currently on Lozol and Bystolic.           Discharge Diagnoses:  Principal Problem:   Sepsis due to pneumonia [facility]) Active Problems:   Acute encephalopathy   Hypokalemia   Conjunctivitis   Normocytic anemia   Essential hypertension   Microscopic hematuria   Sepsis [facility])      Consultations: Psychiatry Curbside neurology  Subjective: Seen and examined at bedside, no complaints.  Overall feeling well.  Discharge Exam: Vitals:   04/23/22 1249 04/23/22 2034  BP: 128/86 133/73  Pulse: 89 82  Resp: (!) 22 17  Temp: (!) 97.5 F (36.4 C) 98.2 F (36.8 C)  SpO2: 100% 100%   Vitals:   04/22/22 2106 04/23/22 0418 04/23/22 1249 04/23/22 2034  BP: 118/76 (!) 146/83 128/86 133/73  Pulse: 92 91 89 82  Resp: 17 16 (!) 22 17  Temp: 99.2 F (37.3 C) 99 F (37.2 C) (!) 97.5 F (36.4 C) 98.2 F (36.8 C)  TempSrc: Oral Oral Oral Oral  SpO2: 98% 99% 100% 100%  Weight:  Height:        General: Pt is alert, awake, not in acute distress Cardiovascular: RRR, S1/S2 +, no rubs, no gallops Respiratory: CTA bilaterally, no wheezing, no rhonchi Abdominal: Soft, NT, ND, bowel sounds + Extremities: no edema, no cyanosis  Discharge Instructions   Allergies as of 04/24/2022       Reactions   Lisinopril Swelling        Medication  List     STOP taking these medications    amoxicillin 500 MG capsule Commonly known as: AMOXIL   ciprofloxacin-dexamethasone OTIC suspension Commonly known as: CIPRODEX       TAKE these medications    albuterol 108 (90 Base) MCG/ACT inhaler Commonly known as: VENTOLIN HFA Inhale 2 puffs into the lungs every 6 (six) hours as needed for wheezing or shortness of breath.   amoxicillin-clavulanate 875-125 MG tablet Commonly known as: AUGMENTIN Take 1 tablet by mouth 2 (two) times daily for 2 doses.   Cholecalciferol 50 MCG (2000 UT) Tabs Take 2 tablets (4,000 Units total) by mouth daily.   docusate sodium 100 MG capsule Commonly known as: Colace Take 1 capsule (100 mg total) by mouth daily as needed for mild constipation or moderate constipation.   ferrous sulfate 325 (65 FE) MG tablet Take 1 tablet (325 mg total) by mouth daily with breakfast. Start taking on: April 25, 2022   indapamide 1.25 MG tablet Commonly known as: LOZOL Take 1 tablet (1.25 mg total) by mouth daily.   nebivolol 10 MG tablet Commonly known as: Bystolic Take 1 tablet (10 mg total) by mouth daily.   potassium chloride 10 MEQ tablet Commonly known as: KLOR-CON Take 1 tablet (10 mEq total) by mouth 2 (two) times daily.   thiamine 100 MG tablet Commonly known as: VITAMIN B1 Take 1 tablet (100 mg total) by mouth daily.   trimethoprim-polymyxin b ophthalmic solution Commonly known as: Polytrim Place 2 drops into the right eye every 4 (four) hours.        Follow-up Information     Janith Lima, MD Follow up in 1 week(s).   Specialty: Internal Medicine Contact information: [place] [address] [phone]                Allergies  Allergen Reactions   Lisinopril Swelling    You were cared for by a hospitalist during your hospital stay. If you have any questions about your discharge medications or the care you received while you were in the hospital  after you are discharged, you can call the unit and asked to speak with the hospitalist on call if the hospitalist that took care of you is not available. Once you are discharged, your primary care physician will handle any further medical issues. Please note that no refills for any discharge medications will be authorized once you are discharged, as it is imperative that you return to your primary care physician (or establish a relationship with a primary care physician if you do not have one) for your aftercare needs so that they can reassess your need for medications and monitor your lab values.   Procedures/Studies: EEG adult  Result Date: 2022/04/26 Lora Havens, MD     Apr 26, 2022  9:54 PM Patient Name: Chariya Deoliveira MRN: [id number] Epilepsy Attending: Lora Havens Referring Physician/Provider: Damita Lack, MD Date: 04-26-2022 Duration: 21.35 mins Patient history: 57yo F with ams. EEG to evaluate for seizure Level of alertness: Awake AEDs during EEG study: None  Technical aspects: This EEG study was done with scalp electrodes positioned according to the 10-20 International system of electrode placement. Electrical activity was reviewed with band pass filter of 1-70Hz$ , sensitivity of 7 uV/mm, display speed of 10m/sec with a 60Hz$  notched filter applied as appropriate. EEG data were recorded continuously and digitally stored.  Video monitoring was available and reviewed as appropriate. Description: The posterior dominant rhythm consists of 10-11 Hz activity of moderate voltage (25-35 uV) seen predominantly in posterior head regions, symmetric and reactive to eye opening and eye closing. EEG showed intermittent generalized high amplitude sharply contoured 6 to 7 Hz theta slowing. Hyperventilation and photic stimulation were not performed.   ABNORMALITY - Intermittent slow, generalized IMPRESSION: This study is suggestive of mild diffuse encephalopathy, nonspecific etiology. No seizures or  epileptiform discharges were seen throughout the recording. PLora Havens  CT CHEST WO CONTRAST  Result Date: 04/21/2022 CLINICAL DATA:  Dyspnea. EXAM: CT CHEST WITHOUT CONTRAST TECHNIQUE: Multidetector CT imaging of the chest was performed following the standard protocol without IV contrast. RADIATION DOSE REDUCTION: This exam was performed according to the departmental dose-optimization program which includes automated exposure control, adjustment of the mA and/or kV according to patient size and/or use of iterative reconstruction technique. COMPARISON:  Chest radiograph, 04/20/2022. FINDINGS: Cardiovascular: Heart normal in size and configuration. No coronary artery calcifications. No pericardial effusion. Normal great vessels. Mediastinum/Nodes: No enlarged mediastinal or axillary lymph nodes. Thyroid gland, trachea, and esophagus demonstrate no significant findings. Lungs/Pleura: Dense consolidation throughout the inferior left lower lobe. Remainder of the lungs is clear. No pleural effusion. No pneumothorax. Upper Abdomen: Unremarkable. Musculoskeletal: No fracture or acute finding. No bone lesion. No chest wall mass. IMPRESSION: 1. Left lower lobe pneumonia. 2. No other acute abnormality. Electronically Signed   By: DLajean ManesM.D.   On: 04/21/2022 12:47   MR BRAIN WO CONTRAST  Result Date: 04/21/2022 CLINICAL DATA:  Mental status change with unknown cause EXAM: MRI HEAD WITHOUT CONTRAST TECHNIQUE: Multiplanar, multiecho pulse sequences of the brain and surrounding structures were obtained without intravenous contrast. COMPARISON:  None Available. FINDINGS: Brain: No acute infarction, hemorrhage, hydrocephalus, extra-axial collection or mass lesion. Mild FLAIR hyperintensity in the cerebral white matter with nonspecific pattern. Brain volume is normal. Vascular: The right ICA is small compared to the left without clear cause at the level of the circle-of-Willis, although limited slices. Skull  and upper cervical spine: Normal marrow signal Sinuses/Orbits: Unremarkable IMPRESSION: 1. No acute intracranial finding. 2. Mild white matter disease without specific pattern. 3. Comparatively small right ICA in the upper neck and skull base, suggest CTA. Electronically Signed   By: JJorje GuildM.D.   On: 04/21/2022 12:46   CT Head Wo Contrast  Result Date: 04/20/2022 CLINICAL DATA:  57year old female with history of altered mental status. EXAM: CT HEAD WITHOUT CONTRAST TECHNIQUE: Contiguous axial images were obtained from the base of the skull through the vertex without intravenous contrast. RADIATION DOSE REDUCTION: This exam was performed according to the departmental dose-optimization program which includes automated exposure control, adjustment of the mA and/or kV according to patient size and/or use of iterative reconstruction technique. COMPARISON:  No priors. FINDINGS: Brain: No evidence of acute infarction, hemorrhage, hydrocephalus, extra-axial collection or mass lesion/mass effect. Vascular: No hyperdense vessel or unexpected calcification. Skull: Normal. Negative for fracture or focal lesion. Sinuses/Orbits: No acute finding. Other: None. IMPRESSION: 1. No acute intracranial abnormalities. The appearance of the brain is normal. Electronically Signed   By: DQuillian Quince  Entrikin M.D.   On: 04/20/2022 16:44   DG Chest 2 View  Result Date: 04/20/2022 CLINICAL DATA:  Cough, altered level of consciousness, possible sepsis EXAM: CHEST - 2 VIEW COMPARISON:  None Available. FINDINGS: Frontal and lateral views of the chest demonstrate an unremarkable cardiac silhouette. Dense left lower lobe airspace disease consistent with pneumonia. Trace left parapneumonic effusion. Right chest is clear. No pneumothorax. IMPRESSION: 1. Dense left lower lobe pneumonia, with trace left parapneumonic effusion. Electronically Signed   By: Randa Ngo M.D.   On: 04/20/2022 16:22     The results of significant  diagnostics from this hospitalization (including imaging, microbiology, ancillary and laboratory) are listed below for reference.     Microbiology: Recent Results (from the past 240 hour(s))  Blood culture (routine x 2)     Status: None (Preliminary result)   Collection Time: 04/20/22  6:41 PM   Specimen: BLOOD  Result Value Ref Range Status   Specimen Description BLOOD LEFT ANTECUBITAL  Final   Special Requests   Final    BOTTLES DRAWN AEROBIC AND ANAEROBIC Blood Culture results may not be optimal due to an inadequate volume of blood received in culture bottles   Culture   Final    NO GROWTH 3 DAYS Performed at [facility], [place] [address] [place], [place] [address]    Report Status PENDING  Incomplete  Respiratory (~20 pathogens) panel by PCR     Status: None   Collection Time: 04/20/22  8:22 PM   Specimen: Nasopharyngeal Swab; Respiratory  Result Value Ref Range Status   Adenovirus NOT DETECTED NOT DETECTED Final   Coronavirus 229E NOT DETECTED NOT DETECTED Final    Comment: (NOTE) The Coronavirus on the Respiratory Panel, DOES NOT test for the novel  Coronavirus (2019 nCoV)    Coronavirus HKU1 NOT DETECTED NOT DETECTED Final   Coronavirus NL63 NOT DETECTED NOT DETECTED Final   Coronavirus OC43 NOT DETECTED NOT DETECTED Final   Metapneumovirus NOT DETECTED NOT DETECTED Final   Rhinovirus / Enterovirus NOT DETECTED NOT DETECTED Final   Influenza A NOT DETECTED NOT DETECTED Final   Influenza B NOT DETECTED NOT DETECTED Final   Parainfluenza Virus 1 NOT DETECTED NOT DETECTED Final   Parainfluenza Virus 2 NOT DETECTED NOT DETECTED Final   Parainfluenza Virus 3 NOT DETECTED NOT DETECTED Final   Parainfluenza Virus 4 NOT DETECTED NOT DETECTED Final   Respiratory Syncytial Virus NOT DETECTED NOT DETECTED Final   Bordetella pertussis NOT DETECTED NOT DETECTED Final   Bordetella Parapertussis NOT DETECTED NOT DETECTED Final   Chlamydophila pneumoniae NOT DETECTED NOT  DETECTED Final   Mycoplasma pneumoniae NOT DETECTED NOT DETECTED Final    Comment: Performed at [facility], [place] [address] [place], [place] [address]  Resp panel by RT-PCR (RSV, Flu A&B, Covid) Nasopharyngeal Swab     Status: None   Collection Time: 04/20/22  8:22 PM   Specimen: Nasopharyngeal Swab; Nasal Swab  Result Value Ref Range Status   SARS Coronavirus 2 by RT PCR NEGATIVE NEGATIVE Final    Comment: (NOTE) SARS-CoV-2 target nucleic acids are NOT DETECTED.  The SARS-CoV-2 RNA is generally detectable in upper respiratory specimens during the acute phase of infection. The lowest concentration of SARS-CoV-2 viral copies this assay can detect is 138 copies/mL. A negative result does not preclude SARS-Cov-2 infection and should not be used as the sole basis for treatment or other patient management decisions. A negative result may occur with  improper specimen collection/handling, submission of specimen other than nasopharyngeal swab, presence of viral mutation(s) within the areas targeted by this assay, and inadequate number of viral copies(<138 copies/mL). A negative result must be combined with clinical observations, patient history, and epidemiological information. The expected result is Negative.  Fact Sheet for Patients:  EntrepreneurPulse.com.au  Fact Sheet for Healthcare Providers:  IncredibleEmployment.be  This test is no t yet approved or cleared by the [place] FDA and  has been authorized for detection and/or diagnosis of SARS-CoV-2 by FDA under an Emergency Use Authorization (EUA). This EUA will remain  in effect (meaning this test can be used) for the duration of the COVID-19 declaration under Section 564(b)(1) of the Act, 21 U.S.C.section 360bbb-3(b)(1), unless the authorization is terminated  or revoked sooner.       Influenza A by PCR NEGATIVE NEGATIVE Final   Influenza B by PCR NEGATIVE NEGATIVE Final     Comment: (NOTE) The Xpert Xpress SARS-CoV-2/FLU/RSV plus assay is intended as an aid in the diagnosis of influenza from Nasopharyngeal swab specimens and should not be used as a sole basis for treatment. Nasal washings and aspirates are unacceptable for Xpert Xpress SARS-CoV-2/FLU/RSV testing.  Fact Sheet for Patients: EntrepreneurPulse.com.au  Fact Sheet for Healthcare Providers: IncredibleEmployment.be  This test is not yet approved or cleared by the [place] FDA and has been authorized for detection and/or diagnosis of SARS-CoV-2 by FDA under an Emergency Use Authorization (EUA). This EUA will remain in effect (meaning this test can be used) for the duration of the COVID-19 declaration under Section 564(b)(1) of the Act, 21 U.S.C. section 360bbb-3(b)(1), unless the authorization is terminated or revoked.     Resp Syncytial Virus by PCR NEGATIVE NEGATIVE Final    Comment: (NOTE) Fact Sheet for Patients: EntrepreneurPulse.com.au  Fact Sheet for Healthcare Providers: IncredibleEmployment.be  This test is not yet approved or cleared by the [place] FDA and has been authorized for detection and/or diagnosis of SARS-CoV-2 by FDA under an Emergency Use Authorization (EUA). This EUA will remain in effect (meaning this test can be used) for the duration of the COVID-19 declaration under Section 564(b)(1) of the Act, 21 U.S.C. section 360bbb-3(b)(1), unless the authorization is terminated or revoked.  Performed at [facility], [place] [address], [place], [place] [address]   Blood culture (routine x 2)     Status: None (Preliminary result)   Collection Time: 04/20/22  9:55 PM   Specimen: BLOOD RIGHT HAND  Result Value Ref Range Status   Specimen Description   Final    BLOOD RIGHT HAND Performed at [facility], [place] [address], [place], [place] [address]    Special Requests    Final    BOTTLES DRAWN AEROBIC ONLY Blood Culture adequate volume Performed at [place] [address], [place], [place] [address]    Culture   Final    NO GROWTH 3 DAYS Performed at [facility], [place] [address] [place], [place] [address]    Report Status PENDING  Incomplete  Urine Culture (for pregnant, neutropenic or urologic patients or patients with an indwelling urinary catheter)     Status: None   Collection Time: 04/20/22 11:15 PM   Specimen: Urine, Clean Catch  Result Value Ref Range Status   Specimen Description   Final    URINE, CLEAN CATCH Performed at [facility], [place] [address] [place],  [address]    Special Requests   Final  NONE Performed at [facility], [place] [address] [place], [place] [address]    Culture   Final    NO GROWTH Performed at [facility], [place] [address], [place], [place] [address]    Report Status 04/22/2022 FINAL  Final     Labs: BNP (last 3 results) Recent Labs    04/21/22 0338  BNP [id number]   Basic Metabolic Panel: Recent Labs  Lab 04/20/22 1550 04/21/22 0338 04/22/22 0343 04/23/22 0414 04/24/22 0409  NA 133* 136 135 136 136  K 2.6* 3.5 3.2* 3.5 4.4  CL 88* 100 102 101 100  CO2 31 25 25 25 26  $ GLUCOSE 110* 100* 97 96 105*  BUN 14 9 7 10 7  $ CREATININE 0.90 0.78 0.70 0.79 0.76  CALCIUM 9.1 8.6* 8.5* 8.9 9.4  MG 2.2  --  2.2 2.2 1.9   Liver Function Tests: Recent Labs  Lab 04/20/22 1550  AST 37  ALT 53*  ALKPHOS 60  BILITOT 0.8  PROT 7.9  ALBUMIN 3.3*   No results for input(s): "LIPASE", "AMYLASE" in the last 168 hours. Recent Labs  Lab 04/20/22 1841  AMMONIA 11   CBC: Recent Labs  Lab 04/20/22 1550 04/21/22 0338 04/22/22 0343 04/23/22 0414 04/24/22 0409  WBC 14.2* 16.2* 10.3 9.5 8.3  NEUTROABS 10.8*  --   --   --   --   HGB 10.7* 10.8* 9.8* 9.9* 10.8*  HCT 32.7* 33.1* 29.9* 30.9* 34.0*  MCV 86.7 85.5 87.2 87.0 88.5  PLT  341 330 312 351 389   Cardiac Enzymes: Recent Labs  Lab 04/22/22 0343  CKTOTAL 163   BNP: Invalid input(s): "POCBNP" CBG: Recent Labs  Lab 04/20/22 1715  GLUCAP 120*   D-Dimer No results for input(s): "DDIMER" in the last 72 hours. Hgb A1c No results for input(s): "HGBA1C" in the last 72 hours. Lipid Profile No results for input(s): "CHOL", "HDL", "LDLCALC", "TRIG", "CHOLHDL", "LDLDIRECT" in the last 72 hours. Thyroid function studies No results for input(s): "TSH", "T4TOTAL", "T3FREE", "THYROIDAB" in the last 72 hours.  Invalid input(s): "FREET3" Anemia work up Recent Labs    04/22/22 1032  FOLATE 10.5   Urinalysis    Component Value Date/Time   COLORURINE YELLOW 04/20/2022 [place] 04/20/2022 1720   LABSPEC 1.018 04/20/2022 1720   PHURINE 6.0 04/20/2022 1720   GLUCOSEU NEGATIVE 04/20/2022 [place] 08/16/2021 1132   HGBUR MODERATE (A) 04/20/2022 1720   BILIRUBINUR NEGATIVE 04/20/2022 1720   KETONESUR 20 (A) 04/20/2022 1720   PROTEINUR 30 (A) 04/20/2022 1720   UROBILINOGEN 0.2 08/16/2021 1132   NITRITE NEGATIVE 04/20/2022 1720   LEUKOCYTESUR NEGATIVE 04/20/2022 1720   Sepsis Labs Recent Labs  Lab 04/21/22 0338 04/22/22 0343 04/23/22 0414 04/24/22 0409  WBC 16.2* 10.3 9.5 8.3   Microbiology Recent Results (from the past 240 hour(s))  Blood culture (routine x 2)     Status: None (Preliminary result)   Collection Time: 04/20/22  6:41 PM   Specimen: BLOOD  Result Value Ref Range Status   Specimen Description BLOOD LEFT ANTECUBITAL  Final   Special Requests   Final    BOTTLES DRAWN AEROBIC AND ANAEROBIC Blood Culture results may not be optimal due to an inadequate volume of blood received in culture bottles   Culture   Final    NO GROWTH 3 DAYS Performed at [facility], [place] [address] [place], [place] [address]    Report  Status PENDING  Incomplete  Respiratory (~20 pathogens) panel by PCR     Status: None    Collection Time: 04/20/22  8:22 PM   Specimen: Nasopharyngeal Swab; Respiratory  Result Value Ref Range Status   Adenovirus NOT DETECTED NOT DETECTED Final   Coronavirus 229E NOT DETECTED NOT DETECTED Final    Comment: (NOTE) The Coronavirus on the Respiratory Panel, DOES NOT test for the novel  Coronavirus (2019 nCoV)    Coronavirus HKU1 NOT DETECTED NOT DETECTED Final   Coronavirus NL63 NOT DETECTED NOT DETECTED Final   Coronavirus OC43 NOT DETECTED NOT DETECTED Final   Metapneumovirus NOT DETECTED NOT DETECTED Final   Rhinovirus / Enterovirus NOT DETECTED NOT DETECTED Final   Influenza A NOT DETECTED NOT DETECTED Final   Influenza B NOT DETECTED NOT DETECTED Final   Parainfluenza Virus 1 NOT DETECTED NOT DETECTED Final   Parainfluenza Virus 2 NOT DETECTED NOT DETECTED Final   Parainfluenza Virus 3 NOT DETECTED NOT DETECTED Final   Parainfluenza Virus 4 NOT DETECTED NOT DETECTED Final   Respiratory Syncytial Virus NOT DETECTED NOT DETECTED Final   Bordetella pertussis NOT DETECTED NOT DETECTED Final   Bordetella Parapertussis NOT DETECTED NOT DETECTED Final   Chlamydophila pneumoniae NOT DETECTED NOT DETECTED Final   Mycoplasma pneumoniae NOT DETECTED NOT DETECTED Final    Comment: Performed at [facility], [address], [place], [place] [address]  Resp panel by RT-PCR (RSV, Flu A&B, Covid) Nasopharyngeal Swab     Status: None   Collection Time: 04/20/22  8:22 PM   Specimen: Nasopharyngeal Swab; Nasal Swab  Result Value Ref Range Status   SARS Coronavirus 2 by RT PCR NEGATIVE NEGATIVE Final    Comment: (NOTE) SARS-CoV-2 target nucleic acids are NOT DETECTED.  The SARS-CoV-2 RNA is generally detectable in upper respiratory specimens during the acute phase of infection. The lowest concentration of SARS-CoV-2 viral copies this assay can detect is 138 copies/mL. A negative result does not preclude SARS-Cov-2 infection and should not be used as the sole basis for treatment  or other patient management decisions. A negative result may occur with  improper specimen collection/handling, submission of specimen other than nasopharyngeal swab, presence of viral mutation(s) within the areas targeted by this assay, and inadequate number of viral copies(<138 copies/mL). A negative result must be combined with clinical observations, patient history, and epidemiological information. The expected result is Negative.  Fact Sheet for Patients:  EntrepreneurPulse.com.au  Fact Sheet for Healthcare Providers:  IncredibleEmployment.be  This test is no t yet approved or cleared by the [place] FDA and  has been authorized for detection and/or diagnosis of SARS-CoV-2 by FDA under an Emergency Use Authorization (EUA). This EUA will remain  in effect (meaning this test can be used) for the duration of the COVID-19 declaration under Section 564(b)(1) of the Act, 21 U.S.C.section 360bbb-3(b)(1), unless the authorization is terminated  or revoked sooner.       Influenza A by PCR NEGATIVE NEGATIVE Final   Influenza B by PCR NEGATIVE NEGATIVE Final    Comment: (NOTE) The Xpert Xpress SARS-CoV-2/FLU/RSV plus assay is intended as an aid in the diagnosis of influenza from Nasopharyngeal swab specimens and should not be used as a sole basis for treatment. Nasal washings and aspirates are unacceptable for Xpert Xpress SARS-CoV-2/FLU/RSV testing.  Fact Sheet for Patients: EntrepreneurPulse.com.au  Fact Sheet for Healthcare Providers: IncredibleEmployment.be  This test is not yet approved or cleared by the [place] FDA and has been  authorized for detection and/or diagnosis of SARS-CoV-2 by FDA under an Emergency Use Authorization (EUA). This EUA will remain in effect (meaning this test can be used) for the duration of the COVID-19 declaration under Section 564(b)(1) of the Act, 21 U.S.C. section  360bbb-3(b)(1), unless the authorization is terminated or revoked.     Resp Syncytial Virus by PCR NEGATIVE NEGATIVE Final    Comment: (NOTE) Fact Sheet for Patients: EntrepreneurPulse.com.au  Fact Sheet for Healthcare Providers: IncredibleEmployment.be  This test is not yet approved or cleared by the [place] FDA and has been authorized for detection and/or diagnosis of SARS-CoV-2 by FDA under an Emergency Use Authorization (EUA). This EUA will remain in effect (meaning this test can be used) for the duration of the COVID-19 declaration under Section 564(b)(1) of the Act, 21 U.S.C. section 360bbb-3(b)(1), unless the authorization is terminated or revoked.  Performed at [facility], [place] [address], [place], [place] [address]   Blood culture (routine x 2)     Status: None (Preliminary result)   Collection Time: 04/20/22  9:55 PM   Specimen: BLOOD RIGHT HAND  Result Value Ref Range Status   Specimen Description   Final    BLOOD RIGHT HAND Performed at [facility], [place] [address], [place], [place] [address]    Special Requests   Final    BOTTLES DRAWN AEROBIC ONLY Blood Culture adequate volume Performed at [place] [address], [place], [place] [address]    Culture   Final    NO GROWTH 3 DAYS Performed at [facility], [place] [address], [place], [place] [address]    Report Status PENDING  Incomplete  Urine Culture (for pregnant, neutropenic or urologic patients or patients with an indwelling urinary catheter)     Status: None   Collection Time: 04/20/22 11:15 PM   Specimen: Urine, Clean Catch  Result Value Ref Range Status   Specimen Description   Final    URINE, CLEAN CATCH Performed at [facility], [place] [address] [place], [place] [address]    Special Requests   Final    NONE Performed at [facility], [place] [address],  [place], [place] [address]    Culture   Final    NO GROWTH Performed at [facility], [place] [address] [place], [place] [address]    Report Status 04/22/2022 FINAL  Final     Time coordinating discharge:  I have spent 35 minutes face to face with the patient and on the ward discussing the patients care, assessment, plan and disposition with other care givers. >50% of the time was devoted counseling the patient about the risks and benefits of treatment/Discharge disposition and coordinating care.   SIGNED:   Damita Lack, MD  [facility] 04/24/2022, 12:00 PM   If 7PM-7AM, please contact night-coverage

## 2022-04-25 ENCOUNTER — Encounter: Payer: Self-pay | Admitting: Internal Medicine

## 2022-04-25 ENCOUNTER — Telehealth: Payer: Self-pay

## 2022-04-25 ENCOUNTER — Ambulatory Visit (HOSPITAL_COMMUNITY)
Admission: EM | Admit: 2022-04-25 | Discharge: 2022-04-25 | Disposition: A | Discharge status: Home / Self Care | Payer: 59 | Attending: Behavioral Health | Admitting: Behavioral Health

## 2022-04-25 DIAGNOSIS — Z7689 Persons encountering health services in other specified circumstances: Secondary | ICD-10-CM | POA: Diagnosis not present

## 2022-04-25 DIAGNOSIS — R410 Disorientation, unspecified: Secondary | ICD-10-CM | POA: Diagnosis not present

## 2022-04-25 LAB — VITAMIN B1
Vitamin B1 (Thiamine): 81.2 nmol/L (ref 66.5–200.0)
Vitamin B1 (Thiamine): 225.6 nmol/L — ABNORMAL HIGH (ref 66.5–200.0)

## 2022-04-25 MED ORDER — TRAZODONE HCL 50 MG PO TABS: 50.0000 mg | ORAL_TABLET | Freq: Every evening | ORAL | 0 refills | Status: DC | PRN

## 2022-04-25 NOTE — ED Provider Notes (Signed)
Behavioral Health Urgent Care Medical Screening Exam  Patient Name: Tracy Walton MRN: [id number] Date of Evaluation: 04/25/22 Chief Complaint:  psych evaluation  Diagnosis:  Final diagnoses:  Encounter for psychiatric assessment    History of Present illness: Tracy Walton is a 57 y.o. female patient with no past psychiatric history who presents to the [facility] behavioral health urgent care voluntary accompanied by [employer] for psychiatric evaluation.   Patient seen and evaluated face-to-face by this provider, chart reviewed and case discussed with Dr. Lovette Cliche. Per chart review, patient was recently hospitalized at [facility] from 04/20/2022 to 04/24/2022 at that she was found to have been confused with concerns for possible sepsis due to pneumonia, and acute encephalopathy secondary to sepsis from pneumonia.  On evaluation, patient is alert and oriented to person, place, time, and situation. Patient's immediate, recent and remote memory intact. Her thought process is linear and speech is clear and coherent at a decreased tone. Her mood is euthymic and affect is congruent. She has fair eye contact. She appears casually dressed. She is calm and cooperative and does not appear to be in acute distress.   Patient states that she has been trying to go home since she was discharged from the hospital yesterday. She states that when she left the hospital yesterday she was taken to her parents house. She states that she has been frustrated at her mother because she is not able to go home, work, or even go to the grocery store to get some fresh fruit. She states that she is bored at her parents house. She states that nothing was communicated with her about staying at her parents house. She states that it is frustrating and not knowing what is happening and she needs to do her finances so that she can pay her rent, power bill and check emails. She states that when she returned home today she was  met by a Education officer, museum, home health aid and her sister. She states that know one told her that she wasn't staying at her home or why there was an intervention. She states that she became frustrated and was brought here ([facility]). She states that she has worked for [employer] for the past 35 years and is soon to retired. She states that she has not been contact with her employer to discuss why she has been out of work. She states that she typically works a hybrid schedule. She states that she has a lot of stuff that she needs to do but she feels like she is in prison at her parents house. She denies suicidal ideations. She denies past suicide attempts. She denies self-injurious behaviors. She denies homicidal ideations. She denies depressive or manic symptoms. She reports poor sleep due to not being at home. She reports a fair appetite and states that she would like to go to the grocery store to get some foods that she likes because all her parents have is food from [employer]. She denies auditory or visual hallucinations. There is no objective evidence that the patient is currently responding to internal or external stimuli. She denies using illicit drugs or alcohol use. She states that she resides alone but is currently having to stay with her parents. She states that she works full-time for the [place] [employer] but has not been to work due to recently being hospitalized.  With the patient's verbal consent I spoke to her mother Floreine Leier face-to-face without the patient present to obtain collateral information.  Roanna Raider states that the patient was acting erratic today after they went to the patient's apartment to set up home health care services.She states that last night the patient did not sleep and was up pacing.She states that the patient changed her clothes every hour last night. She states that the patient was a bit confused this morning and told her it was time to order breakfast and thought  that she was still in the hospital. She states that the patient became agitated with her and stood outside in the lobby and took the stairs and refused to use the elevator which she typically uses. She states that the patient was on the phone at all hours and she had to take the patient's phone away from her. Margarie states that these are not the patient's normal behaviors at that the behaviors started around April 12, 2022.  I discussed with the patient and the patient's mother together in the assessment room a safety plan for the patient to return home this evening. I allowed time for the patient to express her feelings to her mother. The patient conveyed to her mother that she would like to be apart of her plan of care, she would like to be able to return home during the daytime, go to the grocery store, be able to exercise, pay bills, and reach out to her supervisor at work. The patient's mother verbalizes understanding. I discussed with the patient at length the importance of adequate rest, sleep, and hydration in recovering from a severe infection.  Patient verbalizes. Patient agreeable to staying with her parents until they can agree that she is well enough to return back home alone. I discussed with the patient the risks and benefits of starting trazodone 50 mg by mouth nightly as needed for sleep. Patient informed that trazodone is an antidepressant used for sleep. I discussed common side effects which includes sexual dysfunction, dizziness, drowsiness, dry mouth, and constipation. Patient verbalized understanding and agrees to taking the stated medication. Patient denies concerns for pregnancy. Patient current home medications reviewed. Allergies reviewed with patient.   Total Time Spent in Direct Patient Care:  I personally spent 60 minutes on the unit in direct patient care. The direct patient care time included face-to-face time with the patient, reviewing the patient's chart, communicating with  other professionals, and coordinating care. Greater than 50% of this time was spent in counseling or coordinating care with the patient regarding goals of medication management, psycho-education, and discharge planning needs.    [facility] from 04/25/2022 in [facility] ED to Hosp-Admission (Discharged) from 04/20/2022 in [place] CATEGORY No Risk No Risk       Psychiatric Specialty Exam  Presentation  General Appearance:Appropriate for Environment  Eye Contact:Fair  Speech:Clear and Coherent  Speech Volume:Normal  Handedness:Right   Mood and Affect  Mood: Euthymic  Affect: Congruent   Thought Process  Thought Processes: Coherent  Descriptions of Associations:Intact  Orientation:Full (Time, Place and Person)  Thought Content:Logical    Hallucinations:None  Ideas of Reference:None  Suicidal Thoughts:No  Homicidal Thoughts:No   Sensorium  Memory: Immediate Fair; Recent Fair; Remote Fair  Judgment: Fair  Insight: Fair   Community education officer  Concentration: Fair  Attention Span: Fair  Recall: [employer] of Knowledge: Fair  Language: Fair   Psychomotor Activity  Psychomotor Activity: Mannerisms   Assets  Assets: Communication Skills; Desire for Improvement; Financial Resources/Insurance; Housing; Leisure Time;  Social Support; Talents/Skills   Sleep  Sleep: Poor  Physical Exam: Physical Exam HENT:     Head: Normocephalic.     Nose: Nose normal.  Eyes:     Conjunctiva/sclera: Conjunctivae normal.  Cardiovascular:     Rate and Rhythm: Normal rate.  Pulmonary:     Effort: Pulmonary effort is normal.  Musculoskeletal:        General: Normal range of motion.     Cervical back: Normal range of motion.  Neurological:     Mental Status: She is alert and oriented to person, place, and time.   Review of Systems  Constitutional: Negative.    HENT: Negative.    Eyes: Negative.   Respiratory:  Positive for cough.   Cardiovascular: Negative.   Neurological: Negative.   Endo/Heme/Allergies: Negative.    Blood pressure 116/69, pulse 100, temperature 98.4 F (36.9 C), temperature source Oral, SpO2 100 %. There is no height or weight on file to calculate BMI.  Musculoskeletal: Strength & Muscle Tone: within normal limits Gait & Station: normal Patient leans: N/A   [facility] Discharge Disposition for Follow up and Recommendations: Based on my evaluation the patient does not appear to have an emergency medical condition and can be discharged with resources and follow up care in outpatient services for Medication Management  Patient do not appear to be at imminent risk of dangerousness to self and dangerousness to others at this time. While future psychiatric events cannot be accurately predicted, the patient does not necessitate nor desire further acute inpatient psychiatric care at this time. This patient presents with low risk factors. These are mitigated by protective factors which include lack of active SI/HI, no history of previous suicide attempts , no history of violence, sense of responsibility to family and social supports, presence of an available support system, employment or functioning in a structured work/academic setting, enjoyment of leisure actvities, expresses purpose for living, and effective problem solving skills.  Discharge recommendations:   Medications: Patient is to take medications as prescribed. The patient or patient's guardian is to contact a medical professional and/or outpatient provider to address any new side effects that develop. The patient or the patient's guardian should update outpatient providers of any new medications and/or medication changes.   Outpatient Follow up: Please follow up with [facility] for your hospital follow up appointment tomorrow, 04/26/22 at 1:20 pm.   Safety:   The  following safety precautions should be taken:   No sharp objects. This includes scissors, razors, scrapers, and putty knives.   Chemicals should be removed and locked up.   Medications should be removed and locked up.   Weapons should be removed and locked up. This includes firearms, knives and instruments that can be used to cause injury.   The patient should abstain from use of illicit substances/drugs and abuse of any medications.  If symptoms worsen or do not continue to improve or if the patient becomes actively suicidal or homicidal then it is recommended that the patient return to the closest hospital emergency department, the [facility], or call 911 for further evaluation and treatment. National Suicide Prevention Lifeline 1-800-SUICIDE or (573)754-9095.  About 988 988 offers 24/7 access to trained crisis counselors who can help people experiencing mental health-related distress. People can call or text 988 or chat 988lifeline.org for themselves or if they are worried about a loved one who may need crisis support.    Marissa Calamity, NP 04/25/2022, 5:50 PM

## 2022-04-25 NOTE — Transitions of Care (Post Inpatient/ED Visit) (Signed)
   04/25/2022  Name: Tracy Walton MRN: [id number] DOB: 1965/12/06  Today's TOC FU Call Status: Today's TOC FU Call Status:: Successful TOC FU Call Competed Unsuccessful Call (1st Attempt) Date: 04/25/22 [facility] FU Call Complete Date: 04/25/22  Transition Care Management Follow-up Telephone Call Date of Discharge: 04/24/22 Discharge Facility: [facility]) Type of Discharge: Inpatient Admission Primary Inpatient Discharge Diagnosis:: Sepsis due to pneumonia How have you been since you were released from the hospital?: Better Any questions or concerns?: No  Items Reviewed: Did you receive and understand the discharge instructions provided?: Yes Medications obtained and verified?: Yes (Medications Reviewed) Any new allergies since your discharge?: No Dietary orders reviewed?: Yes Do you have support at home?: Yes  Home Care and Equipment/Supplies: [place] Ordered?: No Any new equipment or medical supplies ordered?: No  Functional Questionnaire: Do you need assistance with bathing/showering or dressing?: No Do you need assistance with meal preparation?: No Do you need assistance with eating?: No Do you have difficulty maintaining continence: No Do you need assistance with getting out of bed/getting out of a chair/moving?: No Do you have difficulty managing or taking your medications?: No  Folllow up appointments reviewed: PCP Follow-up appointment confirmed?: Yes Date of PCP follow-up appointment?: 04/26/22 Follow-up Provider: Dr Quay Burow [facility] Follow-up appointment confirmed?: No Do you need transportation to your follow-up appointment?: No Do you understand care options if your condition(s) worsen?: Yes-patient verbalized understanding    [place] LPN [place] Direct Dial [phone]

## 2022-04-25 NOTE — Patient Instructions (Addendum)
      Blood work was ordered.   The lab is on the first floor.    Medications changes include :   stop polytrim eye drops     Return for folow up with Dr Ronnald Ramp on March 4th.

## 2022-04-25 NOTE — Progress Notes (Signed)
   04/25/22 1532  [place] (Walk-ins at [facility] only)  How Did You Hear About [place]? Legal System  What Is the Reason for Your Visit/Call Today? Tracy Walton is  a 57 y/o female presenting to the [facility]. Hx of anxiety. She arrived via [employer], no IVC. Patient is unclear why [employer] transported her to the [facility]. She explains that she was medically admitted for "Sepsis due to pnemonia" x4 days. Discharged from the medical floor yesterday. Upon discharge yesterday she went home w/ parents. She did not want to be home w/ her parents and asked that they take her home where she lives alone. However, they refused until today. They finally took her home @ 1pm today. Yet, when she arrived, their was a Education officer, museum present, in addition to her sister. According to patient, they seemed to be having a meeting that she wasn't informed of, and then police showed up, and escorted her to the [facility]. Denies SI, HI, and AVH's. No hx of substance use. Denies depressive symptoms. Stress: "Getting back home to pay my bills that I've neglected b/c of my hospitalization". Denies that she has a psychiatrist/therapist. No hx of inpt treatment. Employed as a Print production planner.  How Long Has This Been Causing You Problems? > than 6 months  Have You Recently Had Any Thoughts About Hurting Yourself? No  Are You Planning to Commit Suicide/Harm Yourself At This time? No  Have you Recently Had Thoughts About [place]? No  Are You Planning To Harm Someone At This Time? No  Are you currently experiencing any auditory, visual or other hallucinations? No  Have You Used Any Alcohol or Drugs in the Past 24 Hours? No  Do you have any current medical co-morbidities that require immediate attention? No  Clinician description of patient physical appearance/behavior: Patient is calm and cooperative.  What Do You Feel Would Help You the Most Today? Social Support  If access to [facility] was not available, would you have sought  care in the Emergency Department? No  Determination of Need Routine (7 days)  Options For Referral Medication Management;Outpatient Therapy

## 2022-04-25 NOTE — Progress Notes (Unsigned)
Subjective:    Patient ID: Tracy Walton, female    DOB: September 09, 1965, 57 y.o.   MRN: [id number]     HPI Tracy Walton is here for follow up from the hospital.  She is here with her mother.   Admitted 2/17 - 2/21 for sepsis s/s to PNA   Recommendations for Outpatient Follow-up:  Follow up with PCP in 1-2 weeks Please obtain BMP/CBC in one week your next doctors visit.  Psychiatry recommended outpatient EAP Thiamine levels pending upon discharge, supplements given Iron supplements with bowel regimen 1 more day of oral Augmentin   Present to ED after UC for confusion and concern for possible sepsis w/ recent viral infection vs electrolyte issues with excessive water intake. Work up in ED showed [place] and community acquired PNA.  She was seen at [facility] on 2/9 and started on amoxicillin and ciprodex.  She started having SOB, productive cough, ST ear pain, dysphagia.  Fever of 101 on 2/12.  On 2/16 started to have confusion.  On 2/17 she found money in envelopes in her trash can, unpaid bills and envelopes stamped and sealed not in mail.  She had not slept well in the past week.    CXR dense LLL PNA, trace left parapneumonic effusion. Started on IV rocephin and azithromycin. Ct head neg.  Labs normal.  MRI brain neg.  Started on empiric IV thiamine.  EEG neg.  Psych consult advised outpatient EAP.     LLL PNA: D/c'd with 1 day left of augmentin to complete 5 day course CT scan of lungs c/w LLL PNA Strep pneumo antigen, viral panel ( covid, rsv, flu) neg  Acute encephalopaathy: Improved Ct head neg No focal neuro deficits UDS, UA, tsh, ammonia all ok MRI brain neg Am corisol normal B1 pending Started on high dose thiamine 2/18 EEG neg  Occasional dysphagia ? Related to recent laryngitis Performed inconsistent with speech and swallow but doing ok Ok with regular diet, thin liquids  Hypokalemia Repleted  Conjunctivitis: Started on polytrim  Normocytic anemia: Low iron sat,  supplements  Microscopic hematuria: No infection, pos dipstick Normal CK levels  Htn: Continued on lozol, bystolic    She completed the antibiotic.  She is doing her breathing exercises.    Had some phlegm in her throat this morning and had to spit it out.  She states minimal cough.  Denies SOB, wheeze and fever.  She is still fatigued.  Her appetite is good.  She has a little lightheadedness/dizziness.     Voice not back to normal -- still weak .  Had laryngitis the beginning of the month.  Wonders what she can do.     Medications and allergies reviewed with patient and updated if appropriate.  Current Outpatient Medications on File Prior to Visit  Medication Sig Dispense Refill   albuterol (VENTOLIN HFA) 108 (90 Base) MCG/ACT inhaler Inhale 2 puffs into the lungs every 6 (six) hours as needed for wheezing or shortness of breath. 18 g 0   Cholecalciferol 50 MCG (2000 UT) TABS Take 2 tablets (4,000 Units total) by mouth daily. 180 tablet 1   docusate sodium (COLACE) 100 MG capsule Take 1 capsule (100 mg total) by mouth daily as needed for mild constipation or moderate constipation. 30 capsule 0   ferrous sulfate 325 (65 FE) MG tablet Take 1 tablet (325 mg total) by mouth daily with breakfast. 30 tablet 0   indapamide (LOZOL) 1.25 MG tablet Take 1 tablet (1.25 mg  total) by mouth daily. 90 tablet 1   nebivolol (BYSTOLIC) 10 MG tablet Take 1 tablet (10 mg total) by mouth daily. 90 tablet 1   potassium chloride (KLOR-CON) 10 MEQ tablet Take 1 tablet (10 mEq total) by mouth 2 (two) times daily. 180 tablet 1   thiamine (VITAMIN B1) 100 MG tablet Take 1 tablet (100 mg total) by mouth daily. 30 tablet 0   traZODone (DESYREL) 50 MG tablet Take 1 tablet (50 mg total) by mouth at bedtime as needed for sleep. 14 tablet 0   trimethoprim-polymyxin b (POLYTRIM) ophthalmic solution Place 2 drops into the right eye every 4 (four) hours. 10 mL 0   No current facility-administered medications on file  prior to visit.     Review of Systems  Constitutional:  Positive for fatigue. Negative for appetite change (normal) and fever.  Respiratory:  Positive for cough (not a lot). Negative for shortness of breath and wheezing.   Cardiovascular:  Positive for chest pain (this morning). Negative for palpitations and leg swelling.  Gastrointestinal:  Negative for abdominal pain, constipation, diarrhea and nausea.       No gerd  Neurological:  Positive for dizziness and light-headedness. Negative for headaches.       Objective:   Vitals:   04/26/22 1335  BP: 120/78  Pulse: 76  Temp: 99 F (37.2 C)  SpO2: 98%   BP Readings from Last 3 Encounters:  04/26/22 120/78  04/25/22 116/69  04/24/22 115/69   Wt Readings from Last 3 Encounters:  04/26/22 127 lb (57.6 kg)  04/20/22 130 lb 15.3 oz (59.4 kg)  04/19/22 128 lb 3.2 oz (58.2 kg)   Body mass index is 23.23 kg/m.    Physical Exam Constitutional:      General: She is not in acute distress.    Appearance: Normal appearance. She is not ill-appearing.  HENT:     Head: Normocephalic and atraumatic.     Right Ear: Tympanic membrane, ear canal and external ear normal.     Left Ear: Tympanic membrane, ear canal and external ear normal.     Mouth/Throat:     Mouth: Mucous membranes are moist.     Pharynx: No oropharyngeal exudate or posterior oropharyngeal erythema.  Eyes:     Conjunctiva/sclera: Conjunctivae normal.  Cardiovascular:     Rate and Rhythm: Normal rate and regular rhythm.     Heart sounds: Normal heart sounds.  Pulmonary:     Effort: Pulmonary effort is normal. No respiratory distress.     Breath sounds: Normal breath sounds. No wheezing or rales.  Abdominal:     General: There is no distension.     Palpations: Abdomen is soft.     Tenderness: There is no abdominal tenderness.  Musculoskeletal:     Cervical back: Neck supple. No tenderness.     Right lower leg: No edema.     Left lower leg: No edema.   Lymphadenopathy:     Cervical: No cervical adenopathy.  Skin:    General: Skin is warm and dry.     Findings: No rash.  Neurological:     Mental Status: She is alert. Mental status is at baseline.  Psychiatric:        Mood and Affect: Mood normal.        Behavior: Behavior normal.        Lab Results  Component Value Date   WBC 8.3 04/24/2022   HGB 10.8 (L) 04/24/2022   HCT 34.0 (L)  04/24/2022   PLT 389 04/24/2022   GLUCOSE 105 (H) 04/24/2022   CHOL 176 08/16/2021   TRIG 151.0 (H) 08/16/2021   HDL 49.70 08/16/2021   LDLCALC 96 08/16/2021   ALT 53 (H) 04/20/2022   AST 37 04/20/2022   NA 136 04/24/2022   K 4.4 04/24/2022   CL 100 04/24/2022   CREATININE 0.76 04/24/2022   BUN 7 04/24/2022   CO2 26 04/24/2022   TSH 1.267 04/20/2022   EEG adult Lora Havens, MD     04/22/2022  9:54 PM Patient Name: Davelyn Mcshane  MRN: [id number]  Epilepsy Attending: Lora Havens  Referring Physician/Provider: Damita Lack, MD  Date: 04/22/2022 Duration: 21.35 mins  Patient history: 57yo F with ams. EEG to evaluate for seizure  Level of alertness: Awake  AEDs during EEG study: None  Technical aspects: This EEG study was done with scalp electrodes  positioned according to the 10-20 International system of  electrode placement. Electrical activity was reviewed with band  pass filter of 1-'70Hz'$ , sensitivity of 7 uV/mm, display speed of  31m/sec with a '60Hz'$  notched filter applied as appropriate. EEG  data were recorded continuously and digitally stored.  Video  monitoring was available and reviewed as appropriate.  Description: The posterior dominant rhythm consists of 10-11 Hz  activity of moderate voltage (25-35 uV) seen predominantly in  posterior head regions, symmetric and reactive to eye opening and  eye closing. EEG showed intermittent generalized high amplitude  sharply contoured 6 to 7 Hz theta slowing. Hyperventilation and  photic stimulation were not  performed.     ABNORMALITY - Intermittent slow, generalized  IMPRESSION: This study is suggestive of mild diffuse encephalopathy,  nonspecific etiology. No seizures or epileptiform discharges were  seen throughout the recording.  POtis    Assessment & Plan:    Sepsis due to pneumonia: Recently hospitalized Sepsis resolved Completed abx Symptoms much improved Advised fatigue, cough will continue to improve Continue breathing exercises, spirometry Advised increased walking in house to slowly increase stamina Can consider cxr at f/u with PCP to confirm complete resolution Cbc. Bmp today  Acute encephalopathy: Resolved Secondary to pna Not sure of baseline - has f/u with PCP in two weeks   Hypertension: Chronic Well controlled Continue indapamide [id number] daily, bystolic 10 mg daily  Laryngitis: Subacute Had more severe laryngitis earlier this month - still not back to 100% Reassured this should continue to improve - give it more time - rest voice, hot/cold liquids  Sleep difficulties: New Taking trazodone 50 mg HS prn - continue

## 2022-04-25 NOTE — Discharge Instructions (Addendum)
Discharge recommendations:   Medications: Patient is to take medications as prescribed. The patient or patient's guardian is to contact a medical professional and/or outpatient provider to address any new side effects that develop. The patient or the patient's guardian should update outpatient providers of any new medications and/or medication changes.   Outpatient Follow up: Please follow up with [facility] for your hospital follow up appointment tomorrow, 04/26/22 at 1:20 pm.   Safety:   The following safety precautions should be taken:   No sharp objects. This includes scissors, razors, scrapers, and putty knives.   Chemicals should be removed and locked up.   Medications should be removed and locked up.   Weapons should be removed and locked up. This includes firearms, knives and instruments that can be used to cause injury.   The patient should abstain from use of illicit substances/drugs and abuse of any medications.  If symptoms worsen or do not continue to improve or if the patient becomes actively suicidal or homicidal then it is recommended that the patient return to the closest hospital emergency department, the [facility], or call 911 for further evaluation and treatment. National Suicide Prevention Lifeline 1-800-SUICIDE or 909-439-9413.  About 988 988 offers 24/7 access to trained crisis counselors who can help people experiencing mental health-related distress. People can call or text 988 or chat 988lifeline.org for themselves or if they are worried about a loved one who may need crisis support.

## 2022-04-25 NOTE — Transitions of Care (Post Inpatient/ED Visit) (Signed)
   04/25/2022  Name: Tracy Walton MRN: [id number] DOB: Oct 30, 1965  Today's TOC FU Call Status: Today's TOC FU Call Status:: Unsuccessul Call (1st Attempt) Unsuccessful Call (1st Attempt) Date: 04/25/22  Attempted to reach the patient regarding the most recent Inpatient/ED visit.  Follow Up Plan: Additional outreach attempts will be made to reach the patient to complete the Transitions of Care (Post Inpatient/ED visit) call.   [place] LPN [place] Advisor Direct Dial [phone]

## 2022-04-26 ENCOUNTER — Encounter: Payer: Self-pay | Admitting: Internal Medicine

## 2022-04-26 ENCOUNTER — Ambulatory Visit: Payer: 59 | Admitting: Internal Medicine

## 2022-04-26 VITALS — BP 120/78 | HR 76 | Temp 99.0°F | Ht 62.0 in | Wt 127.0 lb

## 2022-04-26 DIAGNOSIS — A419 Sepsis, unspecified organism: Secondary | ICD-10-CM

## 2022-04-26 DIAGNOSIS — Z8619 Personal history of other infectious and parasitic diseases: Secondary | ICD-10-CM

## 2022-04-26 DIAGNOSIS — J040 Acute laryngitis: Secondary | ICD-10-CM

## 2022-04-26 DIAGNOSIS — J189 Pneumonia, unspecified organism: Secondary | ICD-10-CM | POA: Diagnosis not present

## 2022-04-26 DIAGNOSIS — G479 Sleep disorder, unspecified: Secondary | ICD-10-CM

## 2022-04-26 DIAGNOSIS — G9340 Encephalopathy, unspecified: Secondary | ICD-10-CM

## 2022-04-26 DIAGNOSIS — I10 Essential (primary) hypertension: Secondary | ICD-10-CM

## 2022-04-26 LAB — CULTURE, BLOOD (ROUTINE X 2)
Culture: NO GROWTH
Culture: NO GROWTH
Special Requests: ADEQUATE

## 2022-04-26 LAB — CBC WITH DIFFERENTIAL/PLATELET
Basophils Absolute: 0.1 10*3/uL (ref 0.0–0.1)
Basophils Relative: 0.5 % (ref 0.0–3.0)
Eosinophils Absolute: 0 10*3/uL (ref 0.0–0.7)
Eosinophils Relative: 0.4 % (ref 0.0–5.0)
HCT: 34.3 % — ABNORMAL LOW (ref 36.0–46.0)
Hemoglobin: 11.4 g/dL — ABNORMAL LOW (ref 12.0–15.0)
Lymphocytes Relative: 14.2 % (ref 12.0–46.0)
Lymphs Abs: 1.4 10*3/uL (ref 0.7–4.0)
MCHC: 33.4 g/dL (ref 30.0–36.0)
MCV: 85.5 fl (ref 78.0–100.0)
Monocytes Absolute: 0.8 10*3/uL (ref 0.1–1.0)
Monocytes Relative: 8 % (ref 3.0–12.0)
Neutro Abs: 7.8 10*3/uL — ABNORMAL HIGH (ref 1.4–7.7)
Neutrophils Relative %: 76.9 % (ref 43.0–77.0)
Platelets: 403 10*3/uL — ABNORMAL HIGH (ref 150.0–400.0)
RBC: 4.01 Mil/uL (ref 3.87–5.11)
RDW: 15.5 % (ref 11.5–15.5)
WBC: 10.1 10*3/uL (ref 4.0–10.5)

## 2022-04-26 LAB — BASIC METABOLIC PANEL
BUN: 10 mg/dL (ref 6–23)
CO2: 33 mEq/L — ABNORMAL HIGH (ref 19–32)
Calcium: 10.1 mg/dL (ref 8.4–10.5)
Chloride: 96 mEq/L (ref 96–112)
Creatinine, Ser: 0.79 mg/dL (ref 0.40–1.20)
GFR: 84.41 mL/min (ref >60.00)
Glucose, Bld: 90 mg/dL (ref 70–99)
Potassium: 4.1 mEq/L (ref 3.5–5.1)
Sodium: 135 mEq/L (ref 135–145)

## 2022-05-06 ENCOUNTER — Ambulatory Visit (INDEPENDENT_AMBULATORY_CARE_PROVIDER_SITE_OTHER): Payer: 59

## 2022-05-06 ENCOUNTER — Encounter: Payer: Self-pay | Admitting: Internal Medicine

## 2022-05-06 ENCOUNTER — Ambulatory Visit: Payer: 59 | Admitting: Internal Medicine

## 2022-05-06 VITALS — BP 118/68 | HR 87 | Temp 98.5°F | Resp 16 | Ht 62.0 in | Wt 132.0 lb

## 2022-05-06 DIAGNOSIS — D539 Nutritional anemia, unspecified: Secondary | ICD-10-CM

## 2022-05-06 DIAGNOSIS — J189 Pneumonia, unspecified organism: Secondary | ICD-10-CM | POA: Insufficient documentation

## 2022-05-06 DIAGNOSIS — I10 Essential (primary) hypertension: Secondary | ICD-10-CM | POA: Diagnosis not present

## 2022-05-06 DIAGNOSIS — D508 Other iron deficiency anemias: Secondary | ICD-10-CM | POA: Diagnosis not present

## 2022-05-06 LAB — CBC WITH DIFFERENTIAL/PLATELET
Basophils Absolute: 0 10*3/uL (ref 0.0–0.1)
Basophils Relative: 0.3 % (ref 0.0–3.0)
Eosinophils Absolute: 0 10*3/uL (ref 0.0–0.7)
Eosinophils Relative: 0.6 % (ref 0.0–5.0)
HCT: 33.7 % — ABNORMAL LOW (ref 36.0–46.0)
Hemoglobin: 11.2 g/dL — ABNORMAL LOW (ref 12.0–15.0)
Lymphocytes Relative: 21.4 % (ref 12.0–46.0)
Lymphs Abs: 1.5 10*3/uL (ref 0.7–4.0)
MCHC: 33.2 g/dL (ref 30.0–36.0)
MCV: 87.4 fl (ref 78.0–100.0)
Monocytes Absolute: 0.6 10*3/uL (ref 0.1–1.0)
Monocytes Relative: 9.2 % (ref 3.0–12.0)
Neutro Abs: 4.8 10*3/uL (ref 1.4–7.7)
Neutrophils Relative %: 68.5 % (ref 43.0–77.0)
Platelets: 233 10*3/uL (ref 150.0–400.0)
RBC: 3.86 Mil/uL — ABNORMAL LOW (ref 3.87–5.11)
RDW: 16.7 % — ABNORMAL HIGH (ref 11.5–15.5)
WBC: 7 10*3/uL (ref 4.0–10.5)

## 2022-05-06 LAB — BASIC METABOLIC PANEL
BUN: 10 mg/dL (ref 6–23)
CO2: 35 mEq/L — ABNORMAL HIGH (ref 19–32)
Calcium: 9.6 mg/dL (ref 8.4–10.5)
Chloride: 99 mEq/L (ref 96–112)
Creatinine, Ser: 0.77 mg/dL (ref 0.40–1.20)
GFR: 87.03 mL/min (ref >60.00)
Glucose, Bld: 91 mg/dL (ref 70–99)
Potassium: 3.5 mEq/L (ref 3.5–5.1)
Sodium: 139 mEq/L (ref 135–145)

## 2022-05-06 LAB — IBC + FERRITIN
Ferritin: 77.5 ng/mL (ref 10.0–291.0)
Iron: 41 ug/dL — ABNORMAL LOW (ref 42–145)
Saturation Ratios: 13.6 % — ABNORMAL LOW (ref 20.0–50.0)
TIBC: 301 ug/dL (ref 250.0–450.0)
Transferrin: 215 mg/dL (ref 212.0–360.0)

## 2022-05-06 MED ORDER — NEBIVOLOL HCL 10 MG PO TABS: 10.0000 mg | ORAL_TABLET | Freq: Every day | ORAL | 0 refills | Status: DC

## 2022-05-06 NOTE — Progress Notes (Unsigned)
Subjective:  Patient ID: Tracy Walton, female    DOB: 08/13/1965  Age: 57 y.o. MRN: [id number]  CC: Anemia and Hypertension   HPI Tracy Walton presents for f/up ---  She tells me that her cough is nonproductive and has significantly improved.  She continues to complain of laryngitis.  She denies chest pain, fever, chills, night sweats, or hemoptysis.  She is taking the iron supplement but continues to complain of fatigue.  Outpatient Medications Prior to Visit  Medication Sig Dispense Refill   Cholecalciferol 50 MCG (2000 UT) TABS Take 2 tablets (4,000 Units total) by mouth daily. 180 tablet 1   docusate sodium (COLACE) 100 MG capsule Take 1 capsule (100 mg total) by mouth daily as needed for mild constipation or moderate constipation. 30 capsule 0   potassium chloride (KLOR-CON) 10 MEQ tablet Take 1 tablet (10 mEq total) by mouth 2 (two) times daily. 180 tablet 1   thiamine (VITAMIN B1) 100 MG tablet Take 1 tablet (100 mg total) by mouth daily. 30 tablet 0   albuterol (VENTOLIN HFA) 108 (90 Base) MCG/ACT inhaler Inhale 2 puffs into the lungs every 6 (six) hours as needed for wheezing or shortness of breath. 18 g 0   ferrous sulfate 325 (65 FE) MG tablet Take 1 tablet (325 mg total) by mouth daily with breakfast. 30 tablet 0   indapamide (LOZOL) 1.25 MG tablet Take 1 tablet (1.25 mg total) by mouth daily. 90 tablet 1   nebivolol (BYSTOLIC) 10 MG tablet Take 1 tablet (10 mg total) by mouth daily. 90 tablet 1   traZODone (DESYREL) 50 MG tablet Take 1 tablet (50 mg total) by mouth at bedtime as needed for sleep. (Patient not taking: Reported on 05/06/2022) 14 tablet 0   No facility-administered medications prior to visit.    ROS Review of Systems  Constitutional:  Positive for fatigue. Negative for appetite change, chills, diaphoresis, fever and unexpected weight change.  HENT:  Positive for voice change. Negative for sore throat and trouble swallowing.   Eyes: Negative.   Respiratory:   Negative for cough, chest tightness, shortness of breath and wheezing.   Cardiovascular:  Negative for chest pain, palpitations and leg swelling.  Gastrointestinal:  Negative for abdominal pain, blood in stool, constipation, diarrhea, nausea and vomiting.  Endocrine: Negative.   Genitourinary: Negative.  Negative for difficulty urinating.  Musculoskeletal: Negative.  Negative for arthralgias.  Skin: Negative.  Negative for color change and pallor.  Neurological:  Negative for dizziness and weakness.  Hematological:  Negative for adenopathy. Does not bruise/bleed easily.  Psychiatric/Behavioral: Negative.      Objective:  BP 118/68 (BP Location: Left Arm, Patient Position: Sitting, Cuff Size: Normal)   Pulse 87   Temp 98.5 F (36.9 C) (Oral)   Resp 16   Ht '5\' 2"'$  (1.575 m)   Wt 132 lb (59.9 kg)   SpO2 98%   BMI 24.14 kg/m   BP Readings from Last 3 Encounters:  05/06/22 118/68  04/26/22 120/78  04/24/22 115/69    Wt Readings from Last 3 Encounters:  05/06/22 132 lb (59.9 kg)  04/26/22 127 lb (57.6 kg)  04/20/22 130 lb 15.3 oz (59.4 kg)    Physical Exam Vitals reviewed.  Constitutional:      Appearance: She is not ill-appearing.  HENT:     Right Ear: Hearing, tympanic membrane, ear canal and external ear normal.     Left Ear: Hearing, tympanic membrane, ear canal and external ear normal.  Mouth/Throat:     Mouth: Mucous membranes are moist.  Eyes:     General: No scleral icterus.    Conjunctiva/sclera: Conjunctivae normal.  Cardiovascular:     Rate and Rhythm: Normal rate and regular rhythm.     Heart sounds: No murmur heard.    No gallop.  Pulmonary:     Effort: Pulmonary effort is normal.     Breath sounds: No stridor. No wheezing, rhonchi or rales.  Abdominal:     General: Abdomen is flat.     Palpations: There is no mass.     Tenderness: There is no abdominal tenderness. There is no guarding.     Hernia: No hernia is present.  Musculoskeletal:         General: Normal range of motion.     Cervical back: Neck supple.     Right lower leg: No edema.     Left lower leg: No edema.  Lymphadenopathy:     Cervical: No cervical adenopathy.  Skin:    General: Skin is warm and dry.  Neurological:     General: No focal deficit present.     Mental Status: She is alert.  Psychiatric:        Mood and Affect: Mood normal.        Behavior: Behavior normal.     Lab Results  Component Value Date   WBC 7.0 05/06/2022   HGB 11.2 (L) 05/06/2022   HCT 33.7 (L) 05/06/2022   PLT 233.0 05/06/2022   GLUCOSE 91 05/06/2022   CHOL 176 08/16/2021   TRIG 151.0 (H) 08/16/2021   HDL 49.70 08/16/2021   LDLCALC 96 08/16/2021   ALT 53 (H) 04/20/2022   AST 37 04/20/2022   NA 139 05/06/2022   K 3.5 05/06/2022   CL 99 05/06/2022   CREATININE 0.77 05/06/2022   BUN 10 05/06/2022   CO2 35 (H) 05/06/2022   TSH 1.267 04/20/2022    DG Chest 2 View  Result Date: 05/06/2022 CLINICAL DATA:  Left lower lobe pneumonia.  Follow-up. EXAM: CHEST - 2 VIEW COMPARISON:  Chest radiographs 04/20/2022, CT chest 04/21/2022 FINDINGS: Cardiac silhouette and mediastinal contours are within limits. There is mildly improved aeration of the right lower lung. There are some persistent horizontal linear densities that also blunt the left costophrenic angle. Interval decrease in persistent mild posteroinferior left lung heterogeneous airspace opacity on lateral view. No definite pleural effusion. No pneumothorax. Mild multilevel degenerative disc changes of the thoracic spine. IMPRESSION: 1. Improved aeration of the right lower lung. 2. Interval decrease in size and density of persistent mild posteroinferior left lung heterogeneous airspace opacity. This may represent resolving pneumonia or atelectasis. Electronically Signed   By: Yvonne Kendall M.D.   On: 05/06/2022 15:51   EEG adult  Result Date: 04/22/2022 Lora Havens, MD     04/22/2022  9:54 PM Patient Name: Tracy Walton MRN:  [id number] Epilepsy Attending: Lora Havens Referring Physician/Provider: Damita Lack, MD Date: 04/22/2022 Duration: 21.35 mins Patient history: 57yo F with ams. EEG to evaluate for seizure Level of alertness: Awake AEDs during EEG study: None Technical aspects: This EEG study was done with scalp electrodes positioned according to the 10-20 International system of electrode placement. Electrical activity was reviewed with band pass filter of 1-'70Hz'$ , sensitivity of 7 uV/mm, display speed of 24m/sec with a '60Hz'$  notched filter applied as appropriate. EEG data were recorded continuously and digitally stored.  Video monitoring was available and reviewed as appropriate. Description: The  posterior dominant rhythm consists of 10-11 Hz activity of moderate voltage (25-35 uV) seen predominantly in posterior head regions, symmetric and reactive to eye opening and eye closing. EEG showed intermittent generalized high amplitude sharply contoured 6 to 7 Hz theta slowing. Hyperventilation and photic stimulation were not performed.   ABNORMALITY - Intermittent slow, generalized IMPRESSION: This study is suggestive of mild diffuse encephalopathy, nonspecific etiology. No seizures or epileptiform discharges were seen throughout the recording. Lora Havens   CT CHEST WO CONTRAST  Result Date: 04/21/2022 CLINICAL DATA:  Dyspnea. EXAM: CT CHEST WITHOUT CONTRAST TECHNIQUE: Multidetector CT imaging of the chest was performed following the standard protocol without IV contrast. RADIATION DOSE REDUCTION: This exam was performed according to the departmental dose-optimization program which includes automated exposure control, adjustment of the mA and/or kV according to patient size and/or use of iterative reconstruction technique. COMPARISON:  Chest radiograph, 04/20/2022. FINDINGS: Cardiovascular: Heart normal in size and configuration. No coronary artery calcifications. No pericardial effusion. Normal great vessels.  Mediastinum/Nodes: No enlarged mediastinal or axillary lymph nodes. Thyroid gland, trachea, and esophagus demonstrate no significant findings. Lungs/Pleura: Dense consolidation throughout the inferior left lower lobe. Remainder of the lungs is clear. No pleural effusion. No pneumothorax. Upper Abdomen: Unremarkable. Musculoskeletal: No fracture or acute finding. No bone lesion. No chest wall mass. IMPRESSION: 1. Left lower lobe pneumonia. 2. No other acute abnormality. Electronically Signed   By: Lajean Manes M.D.   On: 04/21/2022 12:47   MR BRAIN WO CONTRAST  Result Date: 04/21/2022 CLINICAL DATA:  Mental status change with unknown cause EXAM: MRI HEAD WITHOUT CONTRAST TECHNIQUE: Multiplanar, multiecho pulse sequences of the brain and surrounding structures were obtained without intravenous contrast. COMPARISON:  None Available. FINDINGS: Brain: No acute infarction, hemorrhage, hydrocephalus, extra-axial collection or mass lesion. Mild FLAIR hyperintensity in the cerebral white matter with nonspecific pattern. Brain volume is normal. Vascular: The right ICA is small compared to the left without clear cause at the level of the circle-of-Willis, although limited slices. Skull and upper cervical spine: Normal marrow signal Sinuses/Orbits: Unremarkable IMPRESSION: 1. No acute intracranial finding. 2. Mild white matter disease without specific pattern. 3. Comparatively small right ICA in the upper neck and skull base, suggest CTA. Electronically Signed   By: Jorje Guild M.D.   On: 04/21/2022 12:46   CT Head Wo Contrast  Result Date: 04/20/2022 CLINICAL DATA:  57 year old female with history of altered mental status. EXAM: CT HEAD WITHOUT CONTRAST TECHNIQUE: Contiguous axial images were obtained from the base of the skull through the vertex without intravenous contrast. RADIATION DOSE REDUCTION: This exam was performed according to the departmental dose-optimization program which includes automated exposure  control, adjustment of the mA and/or kV according to patient size and/or use of iterative reconstruction technique. COMPARISON:  No priors. FINDINGS: Brain: No evidence of acute infarction, hemorrhage, hydrocephalus, extra-axial collection or mass lesion/mass effect. Vascular: No hyperdense vessel or unexpected calcification. Skull: Normal. Negative for fracture or focal lesion. Sinuses/Orbits: No acute finding. Other: None. IMPRESSION: 1. No acute intracranial abnormalities. The appearance of the brain is normal. Electronically Signed   By: Vinnie Langton M.D.   On: 04/20/2022 16:44   DG Chest 2 View  Result Date: 04/20/2022 CLINICAL DATA:  Cough, altered level of consciousness, possible sepsis EXAM: CHEST - 2 VIEW COMPARISON:  None Available. FINDINGS: Frontal and lateral views of the chest demonstrate an unremarkable cardiac silhouette. Dense left lower lobe airspace disease consistent with pneumonia. Trace left parapneumonic effusion. Right chest is  clear. No pneumothorax. IMPRESSION: 1. Dense left lower lobe pneumonia, with trace left parapneumonic effusion. Electronically Signed   By: Randa Ngo M.D.   On: 04/20/2022 16:22   MM 3D SCREEN BREAST BILATERAL  Result Date: 03/18/2022 CLINICAL DATA:  Screening. EXAM: DIGITAL SCREENING BILATERAL MAMMOGRAM WITH TOMOSYNTHESIS AND CAD TECHNIQUE: Bilateral screening digital craniocaudal and mediolateral oblique mammograms were obtained. Bilateral screening digital breast tomosynthesis was performed. The images were evaluated with computer-aided detection. COMPARISON:  Previous exam(s). ACR Breast Density Category c: The breast tissue is heterogeneously dense, which may obscure small masses. FINDINGS: There are no findings suspicious for malignancy. IMPRESSION: No mammographic evidence of malignancy. A result letter of this screening mammogram will be mailed directly to the patient. RECOMMENDATION: Screening mammogram in one year. (Code:SM-B-01Y) BI-RADS  CATEGORY  1: Negative. Electronically Signed   By: Kristopher Oppenheim M.D.   On: 03/18/2022 13:38     DG Chest 2 View  Result Date: 05/06/2022 CLINICAL DATA:  Left lower lobe pneumonia.  Follow-up. EXAM: CHEST - 2 VIEW COMPARISON:  Chest radiographs 04/20/2022, CT chest 04/21/2022 FINDINGS: Cardiac silhouette and mediastinal contours are within limits. There is mildly improved aeration of the right lower lung. There are some persistent horizontal linear densities that also blunt the left costophrenic angle. Interval decrease in persistent mild posteroinferior left lung heterogeneous airspace opacity on lateral view. No definite pleural effusion. No pneumothorax. Mild multilevel degenerative disc changes of the thoracic spine. IMPRESSION: 1. Improved aeration of the right lower lung. 2. Interval decrease in size and density of persistent mild posteroinferior left lung heterogeneous airspace opacity. This may represent resolving pneumonia or atelectasis. Electronically Signed   By: Yvonne Kendall M.D.   On: 05/06/2022 15:51     Assessment & Plan:   Tracy Walton was seen today for anemia and hypertension.  Diagnoses and all orders for this visit:  Pneumonia of left lower lobe due to infectious organism- Based on her symptoms and x-ray this has improved. -     DG Chest 2 View; Future -     Lactate dehydrogenase; Future -     Lactate dehydrogenase  Iron deficiency anemia secondary to inadequate dietary iron intake- I recommended that she upgrade to a more effective iron supplement. -     Cancel: CBC with Differential/Platelet; Future -     IBC + Ferritin; Future -     CBC with Differential/Platelet; Future -     CBC with Differential/Platelet -     IBC + Ferritin -     Ferric Maltol (ACCRUFER) 30 MG CAPS; Take 1 capsule (30 mg total) by mouth in the morning and at bedtime.  Deficiency anemia- Will evaluate for other vitamin deficiencies. -     IBC + Ferritin; Future -     Zinc; Future -     Cancel:  Vitamin B1; Future -     Cancel: Folate; Future -     CBC with Differential/Platelet; Future -     Reticulocytes; Future -     Reticulocytes -     CBC with Differential/Platelet -     Zinc -     IBC + Ferritin  Essential hypertension-  Her blood pressure is somewhat overcontrolled.  Will discontinue the thiazide diuretic. -     CBC with Differential/Platelet; Future -     Basic metabolic panel; Future -     nebivolol (BYSTOLIC) 10 MG tablet; Take 1 tablet (10 mg total) by mouth daily. -     Basic  metabolic panel -     CBC with Differential/Platelet   I have discontinued [place] Derry Bentler's indapamide, albuterol, ferrous sulfate, and traZODone. I am also having her start on ACCRUFeR. Additionally, I am having her maintain her Cholecalciferol, potassium chloride, thiamine, docusate sodium, and nebivolol.  Meds ordered this encounter  Medications   nebivolol (BYSTOLIC) 10 MG tablet    Sig: Take 1 tablet (10 mg total) by mouth daily.    Dispense:  90 tablet    Refill:  0   Ferric Maltol (ACCRUFER) 30 MG CAPS    Sig: Take 1 capsule (30 mg total) by mouth in the morning and at bedtime.    Dispense:  180 capsule    Refill:  0     Follow-up: Return in about 2 months (around 07/06/2022).  Scarlette Calico, MD

## 2022-05-06 NOTE — Patient Instructions (Signed)

## 2022-05-07 MED ORDER — ACCRUFER 30 MG PO CAPS: 1.0000 | ORAL_CAPSULE | Freq: Two times a day (BID) | ORAL | 0 refills | Status: DC

## 2022-05-13 ENCOUNTER — Telehealth: Payer: Self-pay | Admitting: Internal Medicine

## 2022-05-13 NOTE — Telephone Encounter (Signed)
Patient dropped off document FMLA, to be filled out by provider. Patient requested to send it via Call Patient to pick up within 7-days. Document is located in providers tray at front office.Please advise at [facility] [phone]   PT also offers alternative CB: [phone]

## 2022-05-14 NOTE — Telephone Encounter (Signed)
LVM to discuss.   Clarification is needed on what diagnosis/medical condition the FMLA is for prior to form completion.

## 2022-05-16 DIAGNOSIS — Z0289 Encounter for other administrative examinations: Secondary | ICD-10-CM

## 2022-05-16 NOTE — Telephone Encounter (Signed)
Forms signed  Pt aware that forms are ready for pick up.   Located at the front desk.

## 2022-05-16 NOTE — Telephone Encounter (Signed)
Form needs to be completed due to pt having a Dx of pneumonia.   Form completed and given to PCP to review and sign.

## 2022-05-20 ENCOUNTER — Other Ambulatory Visit: Payer: Self-pay | Admitting: Internal Medicine

## 2022-05-20 DIAGNOSIS — E60 Dietary zinc deficiency: Secondary | ICD-10-CM | POA: Insufficient documentation

## 2022-05-20 DIAGNOSIS — D538 Other specified nutritional anemias: Secondary | ICD-10-CM | POA: Insufficient documentation

## 2022-05-20 LAB — LACTATE DEHYDROGENASE: LDH: 121 U/L (ref 120–250)

## 2022-05-20 LAB — TIQ-NTM

## 2022-05-20 LAB — RETICULOCYTES
ABS Retic: 73150 cells/uL (ref 20000–80000)
Retic Ct Pct: 1.9 %

## 2022-05-20 LAB — ZINC: Zinc: 53 ug/dL — ABNORMAL LOW (ref 60–130)

## 2022-05-20 MED ORDER — ZINC GLUCONATE 50 MG PO TABS: 50.0000 mg | ORAL_TABLET | Freq: Every day | ORAL | 1 refills | Status: DC

## 2022-05-22 ENCOUNTER — Ambulatory Visit: Payer: 59 | Admitting: Internal Medicine

## 2022-06-18 ENCOUNTER — Telehealth: Payer: Self-pay | Admitting: Internal Medicine

## 2022-06-18 NOTE — Telephone Encounter (Signed)
Clance Boll from [facility] called to request a prior authorization for Accrufer for the patient. Best callback for her is [phone].

## 2022-06-28 ENCOUNTER — Telehealth: Payer: Self-pay

## 2022-06-28 ENCOUNTER — Other Ambulatory Visit (HOSPITAL_COMMUNITY): Payer: Self-pay

## 2022-06-28 NOTE — Telephone Encounter (Signed)
Pharmacy Patient Advocate Encounter   Received notification that prior authorization for Accrufer 30mg  caps is required/requested.  Per Test Claim: Product/service not covered - Plan/Benefit exclusion. Use Alt or PA required   PA submitted on 06/28/22 to (ins) [employer] via CoverMyMeds Key  # [id number] Status is pending

## 2022-07-01 ENCOUNTER — Other Ambulatory Visit (HOSPITAL_COMMUNITY): Payer: Self-pay

## 2022-07-01 NOTE — Telephone Encounter (Signed)
Patient Advocate Encounter  Prior Authorization for ACCRUFeR 30MG  capsules has been approved with [employer].    Per [facility] test claim, copay for 30 days supply is $0  PA#  [id number] Effective dates: 06/28/22 through 06/28/23

## 2022-07-01 NOTE — Telephone Encounter (Signed)
PA approved. Effective dates: 06/28/22 through 06/28/23

## 2022-07-08 ENCOUNTER — Ambulatory Visit (INDEPENDENT_AMBULATORY_CARE_PROVIDER_SITE_OTHER): Payer: 59

## 2022-07-08 ENCOUNTER — Encounter: Payer: Self-pay | Admitting: Internal Medicine

## 2022-07-08 ENCOUNTER — Ambulatory Visit: Payer: 59 | Admitting: Internal Medicine

## 2022-07-08 VITALS — BP 134/72 | HR 69 | Temp 99.3°F | Resp 16 | Ht 62.0 in | Wt 133.0 lb

## 2022-07-08 DIAGNOSIS — I10 Essential (primary) hypertension: Secondary | ICD-10-CM | POA: Diagnosis not present

## 2022-07-08 DIAGNOSIS — D508 Other iron deficiency anemias: Secondary | ICD-10-CM | POA: Diagnosis not present

## 2022-07-08 DIAGNOSIS — J189 Pneumonia, unspecified organism: Secondary | ICD-10-CM

## 2022-07-08 DIAGNOSIS — H43392 Other vitreous opacities, left eye: Secondary | ICD-10-CM | POA: Diagnosis not present

## 2022-07-08 DIAGNOSIS — D538 Other specified nutritional anemias: Secondary | ICD-10-CM

## 2022-07-08 DIAGNOSIS — Z124 Encounter for screening for malignant neoplasm of cervix: Secondary | ICD-10-CM

## 2022-07-08 LAB — CBC WITH DIFFERENTIAL/PLATELET
Basophils Absolute: 0 10*3/uL (ref 0.0–0.1)
Basophils Relative: 0.4 % (ref 0.0–3.0)
Eosinophils Absolute: 0.1 10*3/uL (ref 0.0–0.7)
Eosinophils Relative: 0.8 % (ref 0.0–5.0)
HCT: 39.7 % (ref 36.0–46.0)
Hemoglobin: 13.3 g/dL (ref 12.0–15.0)
Lymphocytes Relative: 25.4 % (ref 12.0–46.0)
Lymphs Abs: 2 10*3/uL (ref 0.7–4.0)
MCHC: 33.4 g/dL (ref 30.0–36.0)
MCV: 87.7 fl (ref 78.0–100.0)
Monocytes Absolute: 0.6 10*3/uL (ref 0.1–1.0)
Monocytes Relative: 7.8 % (ref 3.0–12.0)
Neutro Abs: 5.3 10*3/uL (ref 1.4–7.7)
Neutrophils Relative %: 65.6 % (ref 43.0–77.0)
Platelets: 201 10*3/uL (ref 150.0–400.0)
RBC: 4.53 Mil/uL (ref 3.87–5.11)
RDW: 16.2 % — ABNORMAL HIGH (ref 11.5–15.5)
WBC: 8 10*3/uL (ref 4.0–10.5)

## 2022-07-08 LAB — IBC + FERRITIN
Ferritin: 38.3 ng/mL (ref 10.0–291.0)
Iron: 57 ug/dL (ref 42–145)
Saturation Ratios: 15.7 % — ABNORMAL LOW (ref 20.0–50.0)
TIBC: 362.6 ug/dL (ref 250.0–450.0)
Transferrin: 259 mg/dL (ref 212.0–360.0)

## 2022-07-08 MED ORDER — NEBIVOLOL HCL 10 MG PO TABS: 10.0000 mg | ORAL_TABLET | Freq: Every day | ORAL | 0 refills | Status: DC

## 2022-07-08 MED ORDER — ZINC GLUCONATE 50 MG PO TABS: 50.0000 mg | ORAL_TABLET | Freq: Every day | ORAL | 1 refills | Status: AC

## 2022-07-08 NOTE — Patient Instructions (Signed)

## 2022-07-08 NOTE — Progress Notes (Signed)
Subjective:  Patient ID: Tracy Walton, female    DOB: Mar 11, 1965  Age: 57 y.o. MRN: [id number]  CC: Hypertension and Anemia   HPI Tracy Walton presents for f/up ---  Her cough has resolved. She exercises on a treadmill and denies DOE, CP, SOB, edema. She complains of left eye floaters for several weeks  Outpatient Medications Prior to Visit  Medication Sig Dispense Refill   potassium chloride (KLOR-CON) 10 MEQ tablet Take 1 tablet (10 mEq total) by mouth 2 (two) times daily. 180 tablet 1   nebivolol (BYSTOLIC) 10 MG tablet Take 1 tablet (10 mg total) by mouth daily. 90 tablet 0   thiamine (VITAMIN B1) 100 MG tablet Take 1 tablet (100 mg total) by mouth daily. 30 tablet 0   Cholecalciferol 50 MCG (2000 UT) TABS Take 2 tablets (4,000 Units total) by mouth daily. (Patient not taking: Reported on 07/08/2022) 180 tablet 1   Ferric Maltol (ACCRUFER) 30 MG CAPS Take 1 capsule (30 mg total) by mouth in the morning and at bedtime. (Patient not taking: Reported on 07/08/2022) 180 capsule 0   zinc gluconate 50 MG tablet Take 1 tablet (50 mg total) by mouth daily. (Patient not taking: Reported on 07/08/2022) 90 tablet 1   No facility-administered medications prior to visit.    ROS Review of Systems  Constitutional: Negative.  Negative for diaphoresis and fatigue.  HENT: Negative.    Eyes:  Positive for visual disturbance. Negative for photophobia, pain and redness.  Respiratory:  Negative for cough, chest tightness, shortness of breath and wheezing.   Cardiovascular:  Negative for chest pain, palpitations and leg swelling.  Gastrointestinal:  Negative for abdominal pain, constipation, diarrhea, nausea and vomiting.  Endocrine: Negative.   Genitourinary: Negative.  Negative for difficulty urinating.  Musculoskeletal: Negative.   Skin: Negative.   Neurological:  Negative for dizziness and weakness.  Hematological:  Negative for adenopathy. Does not bruise/bleed easily.  Psychiatric/Behavioral:  Negative.      Objective:  BP 134/72 (BP Location: Right Arm, Patient Position: Sitting, Cuff Size: Normal)   Pulse 69   Temp 99.3 F (37.4 C) (Oral)   Resp 16   Ht 5\' 2"  (1.575 m)   Wt 133 lb (60.3 kg)   SpO2 100%   BMI 24.33 kg/m   BP Readings from Last 3 Encounters:  07/08/22 134/72  05/06/22 118/68  04/26/22 120/78    Wt Readings from Last 3 Encounters:  07/08/22 133 lb (60.3 kg)  05/06/22 132 lb (59.9 kg)  04/26/22 127 lb (57.6 kg)    Physical Exam Vitals reviewed.  HENT:     Nose: Nose normal.     Mouth/Throat:     Mouth: Mucous membranes are moist.  Eyes:     General: No scleral icterus.    Conjunctiva/sclera: Conjunctivae normal.  Cardiovascular:     Rate and Rhythm: Normal rate and regular rhythm.     Heart sounds: No murmur heard. Pulmonary:     Effort: Pulmonary effort is normal.     Breath sounds: No stridor. No wheezing, rhonchi or rales.  Abdominal:     General: Abdomen is flat.     Palpations: There is no mass.     Tenderness: There is no abdominal tenderness. There is no guarding.     Hernia: No hernia is present.  Musculoskeletal:     Cervical back: Neck supple.  Lymphadenopathy:     Cervical: No cervical adenopathy.  Skin:    General: Skin is warm and  dry.     Coloration: Skin is not pale.     Findings: No rash.  Neurological:     General: No focal deficit present.     Mental Status: She is alert. Mental status is at baseline.  Psychiatric:        Mood and Affect: Mood normal.        Behavior: Behavior normal.     Lab Results  Component Value Date   WBC 8.0 07/08/2022   HGB 13.3 07/08/2022   HCT 39.7 07/08/2022   PLT 201.0 07/08/2022   GLUCOSE 91 05/06/2022   CHOL 176 08/16/2021   TRIG 151.0 (H) 08/16/2021   HDL 49.70 08/16/2021   LDLCALC 96 08/16/2021   ALT 53 (H) 04/20/2022   AST 37 04/20/2022   NA 139 05/06/2022   K 3.5 05/06/2022   CL 99 05/06/2022   CREATININE 0.77 05/06/2022   BUN 10 05/06/2022   CO2 35 (H)  05/06/2022   TSH 1.267 04/20/2022    DG Chest 2 View  Result Date: 05/06/2022 CLINICAL DATA:  Left lower lobe pneumonia.  Follow-up. EXAM: CHEST - 2 VIEW COMPARISON:  Chest radiographs 04/20/2022, CT chest 04/21/2022 FINDINGS: Cardiac silhouette and mediastinal contours are within limits. There is mildly improved aeration of the right lower lung. There are some persistent horizontal linear densities that also blunt the left costophrenic angle. Interval decrease in persistent mild posteroinferior left lung heterogeneous airspace opacity on lateral view. No definite pleural effusion. No pneumothorax. Mild multilevel degenerative disc changes of the thoracic spine. IMPRESSION: 1. Improved aeration of the right lower lung. 2. Interval decrease in size and density of persistent mild posteroinferior left lung heterogeneous airspace opacity. This may represent resolving pneumonia or atelectasis. Electronically Signed   By: Neita Garnet M.D.   On: 05/06/2022 15:51   EEG adult  Result Date: 04/22/2022 Charlsie Quest, MD     04/22/2022  9:54 PM Patient Name: Nil Anstett MRN: [id number] Epilepsy Attending: Charlsie Quest Referring Physician/Provider: Dimple Nanas, MD Date: 04/22/2022 Duration: 21.35 mins Patient history: 57yo F with ams. EEG to evaluate for seizure Level of alertness: Awake AEDs during EEG study: None Technical aspects: This EEG study was done with scalp electrodes positioned according to the 10-20 International system of electrode placement. Electrical activity was reviewed with band pass filter of 1-70Hz , sensitivity of 7 uV/mm, display speed of 84mm/sec with a 60Hz  notched filter applied as appropriate. EEG data were recorded continuously and digitally stored.  Video monitoring was available and reviewed as appropriate. Description: The posterior dominant rhythm consists of 10-11 Hz activity of moderate voltage (25-35 uV) seen predominantly in posterior head regions, symmetric and  reactive to eye opening and eye closing. EEG showed intermittent generalized high amplitude sharply contoured 6 to 7 Hz theta slowing. Hyperventilation and photic stimulation were not performed.   ABNORMALITY - Intermittent slow, generalized IMPRESSION: This study is suggestive of mild diffuse encephalopathy, nonspecific etiology. No seizures or epileptiform discharges were seen throughout the recording. Charlsie Quest   CT CHEST WO CONTRAST  Result Date: 04/21/2022 CLINICAL DATA:  Dyspnea. EXAM: CT CHEST WITHOUT CONTRAST TECHNIQUE: Multidetector CT imaging of the chest was performed following the standard protocol without IV contrast. RADIATION DOSE REDUCTION: This exam was performed according to the departmental dose-optimization program which includes automated exposure control, adjustment of the mA and/or kV according to patient size and/or use of iterative reconstruction technique. COMPARISON:  Chest radiograph, 04/20/2022. FINDINGS: Cardiovascular: Heart normal in size and  configuration. No coronary artery calcifications. No pericardial effusion. Normal great vessels. Mediastinum/Nodes: No enlarged mediastinal or axillary lymph nodes. Thyroid gland, trachea, and esophagus demonstrate no significant findings. Lungs/Pleura: Dense consolidation throughout the inferior left lower lobe. Remainder of the lungs is clear. No pleural effusion. No pneumothorax. Upper Abdomen: Unremarkable. Musculoskeletal: No fracture or acute finding. No bone lesion. No chest wall mass. IMPRESSION: 1. Left lower lobe pneumonia. 2. No other acute abnormality. Electronically Signed   By: Amie Portland M.D.   On: 04/21/2022 12:47   MR BRAIN WO CONTRAST  Result Date: 04/21/2022 CLINICAL DATA:  Mental status change with unknown cause EXAM: MRI HEAD WITHOUT CONTRAST TECHNIQUE: Multiplanar, multiecho pulse sequences of the brain and surrounding structures were obtained without intravenous contrast. COMPARISON:  None Available.  FINDINGS: Brain: No acute infarction, hemorrhage, hydrocephalus, extra-axial collection or mass lesion. Mild FLAIR hyperintensity in the cerebral white matter with nonspecific pattern. Brain volume is normal. Vascular: The right ICA is small compared to the left without clear cause at the level of the circle-of-Willis, although limited slices. Skull and upper cervical spine: Normal marrow signal Sinuses/Orbits: Unremarkable IMPRESSION: 1. No acute intracranial finding. 2. Mild white matter disease without specific pattern. 3. Comparatively small right ICA in the upper neck and skull base, suggest CTA. Electronically Signed   By: Tiburcio Pea M.D.   On: 04/21/2022 12:46   CT Head Wo Contrast  Result Date: 04/20/2022 CLINICAL DATA:  57 year old female with history of altered mental status. EXAM: CT HEAD WITHOUT CONTRAST TECHNIQUE: Contiguous axial images were obtained from the base of the skull through the vertex without intravenous contrast. RADIATION DOSE REDUCTION: This exam was performed according to the departmental dose-optimization program which includes automated exposure control, adjustment of the mA and/or kV according to patient size and/or use of iterative reconstruction technique. COMPARISON:  No priors. FINDINGS: Brain: No evidence of acute infarction, hemorrhage, hydrocephalus, extra-axial collection or mass lesion/mass effect. Vascular: No hyperdense vessel or unexpected calcification. Skull: Normal. Negative for fracture or focal lesion. Sinuses/Orbits: No acute finding. Other: None. IMPRESSION: 1. No acute intracranial abnormalities. The appearance of the brain is normal. Electronically Signed   By: Trudie Reed M.D.   On: 04/20/2022 16:44   DG Chest 2 View  Result Date: 04/20/2022 CLINICAL DATA:  Cough, altered level of consciousness, possible sepsis EXAM: CHEST - 2 VIEW COMPARISON:  None Available. FINDINGS: Frontal and lateral views of the chest demonstrate an unremarkable cardiac  silhouette. Dense left lower lobe airspace disease consistent with pneumonia. Trace left parapneumonic effusion. Right chest is clear. No pneumothorax. IMPRESSION: 1. Dense left lower lobe pneumonia, with trace left parapneumonic effusion. Electronically Signed   By: Sharlet Salina M.D.   On: 04/20/2022 16:22     DG Chest 2 View  Result Date: 07/11/2022 CLINICAL DATA:  Pneumonia follow-up EXAM: CHEST - 2 VIEW COMPARISON:  Chest radiograph 05/06/2022 FINDINGS: Stable cardiac and mediastinal contours. Minimal residual opacities left lung base. No pleural effusion or pneumothorax. Thoracic spine degenerative changes. IMPRESSION: Minimal residual opacities left lung base may represent scarring or atelectasis. Electronically Signed   By: Annia Belt M.D.   On: 07/11/2022 20:11   DG Chest 2 View  Result Date: 05/06/2022 CLINICAL DATA:  Left lower lobe pneumonia.  Follow-up. EXAM: CHEST - 2 VIEW COMPARISON:  Chest radiographs 04/20/2022, CT chest 04/21/2022 FINDINGS: Cardiac silhouette and mediastinal contours are within limits. There is mildly improved aeration of the right lower lung. There are some persistent horizontal linear densities  that also blunt the left costophrenic angle. Interval decrease in persistent mild posteroinferior left lung heterogeneous airspace opacity on lateral view. No definite pleural effusion. No pneumothorax. Mild multilevel degenerative disc changes of the thoracic spine. IMPRESSION: 1. Improved aeration of the right lower lung. 2. Interval decrease in size and density of persistent mild posteroinferior left lung heterogeneous airspace opacity. This may represent resolving pneumonia or atelectasis. Electronically Signed   By: Neita Garnet M.D.   On: 05/06/2022 15:51   EEG adult  Result Date: 04/22/2022 Charlsie Quest, MD     04/22/2022  9:54 PM Patient Name: Meshayla Tremper MRN: [id number] Epilepsy Attending: Charlsie Quest Referring Physician/Provider: Dimple Nanas, MD  Date: 04/22/2022 Duration: 21.35 mins Patient history: 58yo F with ams. EEG to evaluate for seizure Level of alertness: Awake AEDs during EEG study: None Technical aspects: This EEG study was done with scalp electrodes positioned according to the 10-20 International system of electrode placement. Electrical activity was reviewed with band pass filter of 1-70Hz , sensitivity of 7 uV/mm, display speed of 7mm/sec with a 60Hz  notched filter applied as appropriate. EEG data were recorded continuously and digitally stored.  Video monitoring was available and reviewed as appropriate. Description: The posterior dominant rhythm consists of 10-11 Hz activity of moderate voltage (25-35 uV) seen predominantly in posterior head regions, symmetric and reactive to eye opening and eye closing. EEG showed intermittent generalized high amplitude sharply contoured 6 to 7 Hz theta slowing. Hyperventilation and photic stimulation were not performed.   ABNORMALITY - Intermittent slow, generalized IMPRESSION: This study is suggestive of mild diffuse encephalopathy, nonspecific etiology. No seizures or epileptiform discharges were seen throughout the recording. Charlsie Quest   CT CHEST WO CONTRAST  Result Date: 04/21/2022 CLINICAL DATA:  Dyspnea. EXAM: CT CHEST WITHOUT CONTRAST TECHNIQUE: Multidetector CT imaging of the chest was performed following the standard protocol without IV contrast. RADIATION DOSE REDUCTION: This exam was performed according to the departmental dose-optimization program which includes automated exposure control, adjustment of the mA and/or kV according to patient size and/or use of iterative reconstruction technique. COMPARISON:  Chest radiograph, 04/20/2022. FINDINGS: Cardiovascular: Heart normal in size and configuration. No coronary artery calcifications. No pericardial effusion. Normal great vessels. Mediastinum/Nodes: No enlarged mediastinal or axillary lymph nodes. Thyroid gland, trachea, and esophagus  demonstrate no significant findings. Lungs/Pleura: Dense consolidation throughout the inferior left lower lobe. Remainder of the lungs is clear. No pleural effusion. No pneumothorax. Upper Abdomen: Unremarkable. Musculoskeletal: No fracture or acute finding. No bone lesion. No chest wall mass. IMPRESSION: 1. Left lower lobe pneumonia. 2. No other acute abnormality. Electronically Signed   By: Amie Portland M.D.   On: 04/21/2022 12:47   MR BRAIN WO CONTRAST  Result Date: 04/21/2022 CLINICAL DATA:  Mental status change with unknown cause EXAM: MRI HEAD WITHOUT CONTRAST TECHNIQUE: Multiplanar, multiecho pulse sequences of the brain and surrounding structures were obtained without intravenous contrast. COMPARISON:  None Available. FINDINGS: Brain: No acute infarction, hemorrhage, hydrocephalus, extra-axial collection or mass lesion. Mild FLAIR hyperintensity in the cerebral white matter with nonspecific pattern. Brain volume is normal. Vascular: The right ICA is small compared to the left without clear cause at the level of the circle-of-Willis, although limited slices. Skull and upper cervical spine: Normal marrow signal Sinuses/Orbits: Unremarkable IMPRESSION: 1. No acute intracranial finding. 2. Mild white matter disease without specific pattern. 3. Comparatively small right ICA in the upper neck and skull base, suggest CTA. Electronically Signed   By: Tiburcio Pea  M.D.   On: 04/21/2022 12:46   CT Head Wo Contrast  Result Date: 04/20/2022 CLINICAL DATA:  57 year old female with history of altered mental status. EXAM: CT HEAD WITHOUT CONTRAST TECHNIQUE: Contiguous axial images were obtained from the base of the skull through the vertex without intravenous contrast. RADIATION DOSE REDUCTION: This exam was performed according to the departmental dose-optimization program which includes automated exposure control, adjustment of the mA and/or kV according to patient size and/or use of iterative reconstruction  technique. COMPARISON:  No priors. FINDINGS: Brain: No evidence of acute infarction, hemorrhage, hydrocephalus, extra-axial collection or mass lesion/mass effect. Vascular: No hyperdense vessel or unexpected calcification. Skull: Normal. Negative for fracture or focal lesion. Sinuses/Orbits: No acute finding. Other: None. IMPRESSION: 1. No acute intracranial abnormalities. The appearance of the brain is normal. Electronically Signed   By: Trudie Reed M.D.   On: 04/20/2022 16:44   DG Chest 2 View  Result Date: 04/20/2022 CLINICAL DATA:  Cough, altered level of consciousness, possible sepsis EXAM: CHEST - 2 VIEW COMPARISON:  None Available. FINDINGS: Frontal and lateral views of the chest demonstrate an unremarkable cardiac silhouette. Dense left lower lobe airspace disease consistent with pneumonia. Trace left parapneumonic effusion. Right chest is clear. No pneumothorax. IMPRESSION: 1. Dense left lower lobe pneumonia, with trace left parapneumonic effusion. Electronically Signed   By: Sharlet Salina M.D.   On: 04/20/2022 16:22     Assessment & Plan:   Iron deficiency anemia secondary to inadequate dietary iron intake- H/H are normal now. -     IBC + Ferritin; Future -     CBC with Differential/Platelet; Future  Essential hypertension - BP is well controlled. -     Nebivolol HCl; Take 1 tablet (10 mg total) by mouth daily.  Dispense: 90 tablet; Refill: 0  Vitreous floaters of left eye -     Ambulatory referral to Ophthalmology  Screening for cervical cancer -     Ambulatory referral to Gynecology  Anemia due to zinc deficiency -     Zinc Gluconate; Take 1 tablet (50 mg total) by mouth daily.  Dispense: 90 tablet; Refill: 1  Pneumonia of left lower lobe due to infectious organism - The PNA has resolved. -     DG Chest 2 View; Future     Follow-up: Return in about 3 months (around 10/08/2022).  Sanda Linger, MD

## 2022-07-18 ENCOUNTER — Telehealth: Payer: Self-pay | Admitting: Internal Medicine

## 2022-07-18 NOTE — Telephone Encounter (Signed)
Prescription Request  07/18/2022  LOV: 07/08/2022  What is the name of the medication or equipment?  Ferric Maltol (ACCRUFER) 30 MG CAPS   Have you contacted your pharmacy to request a refill? Yes   Which pharmacy would you like this sent to?  [id number] [place], [place] - [address] AT [facility] [address] [facility] [address] [place] [address] Phone: [phone] Fax: [phone]    Patient notified that their request is being sent to the clinical staff for review and that they should receive a response within 2 business days.   Please advise at Mobile [phone] (mobile)

## 2022-07-19 ENCOUNTER — Other Ambulatory Visit: Payer: Self-pay | Admitting: Internal Medicine

## 2022-07-19 DIAGNOSIS — D508 Other iron deficiency anemias: Secondary | ICD-10-CM

## 2022-07-19 MED ORDER — ACCRUFER 30 MG PO CAPS: 1.0000 | ORAL_CAPSULE | Freq: Two times a day (BID) | ORAL | 0 refills | Status: DC

## 2022-08-03 IMAGING — MG DIGITAL SCREENING BILAT W/ CAD
6 series · 6 of 6 positions shown · non-contrast
Comparison: Previous exam(s).

CLINICAL DATA: Screening.

EXAM:
DIGITAL SCREENING BILATERAL MAMMOGRAM WITH CAD

[L MLO (1 of 3)]
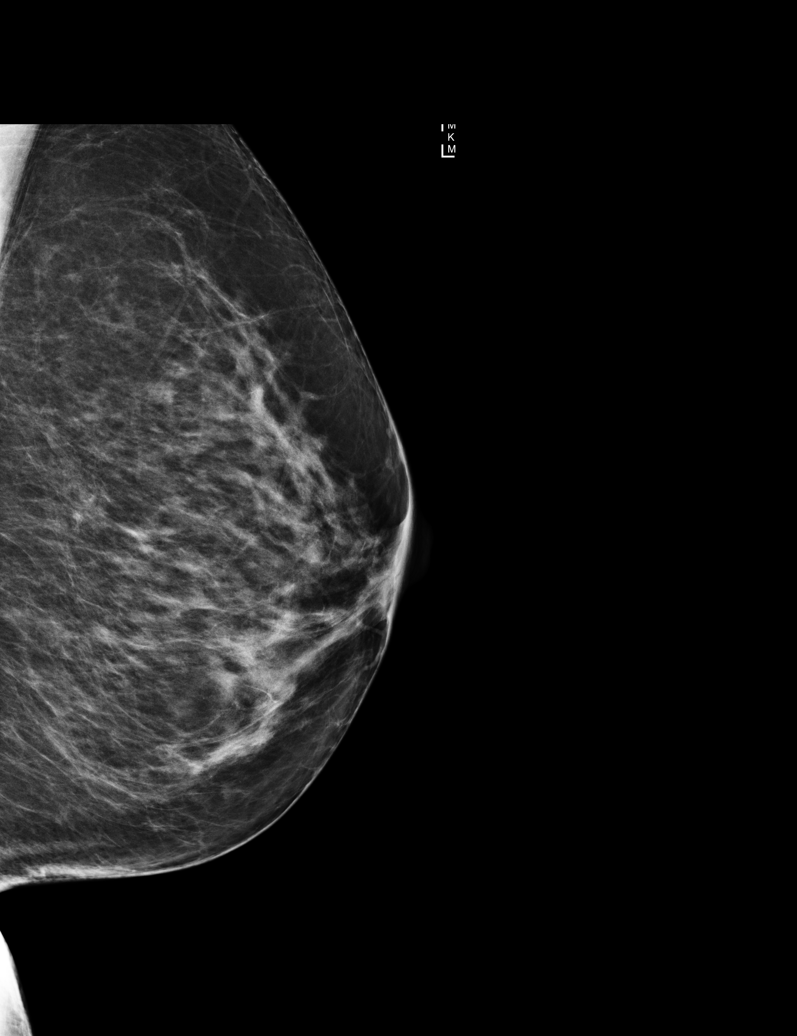

[L CC]
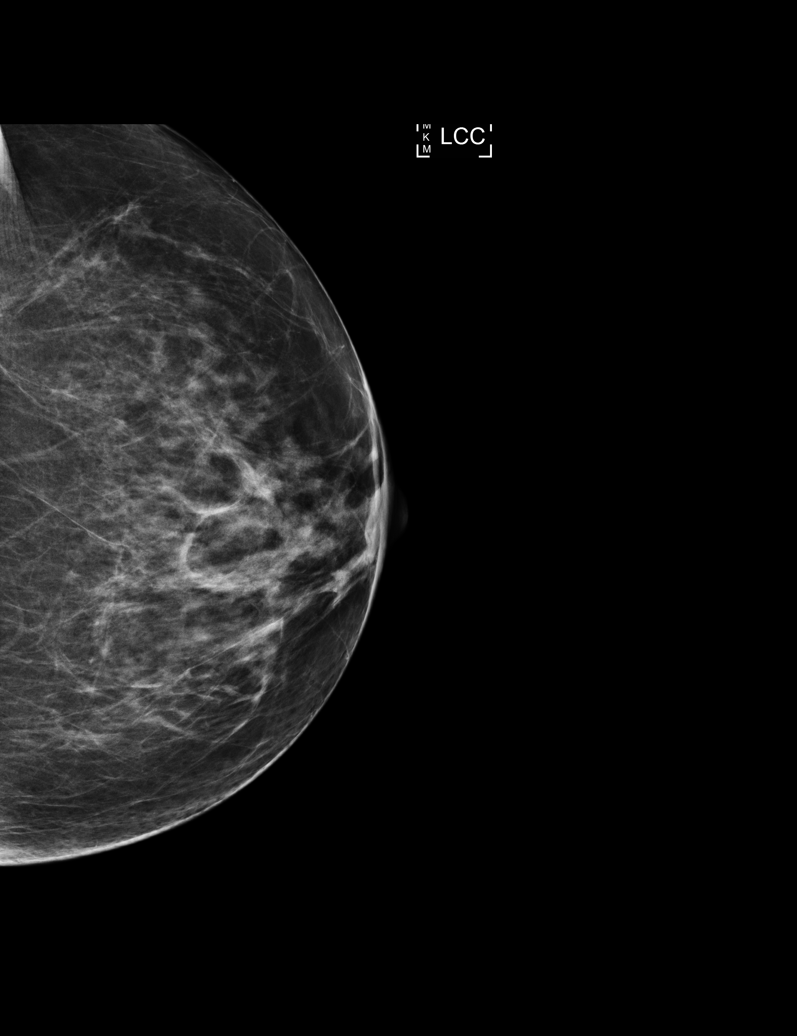

[L MLO (2 of 3)]
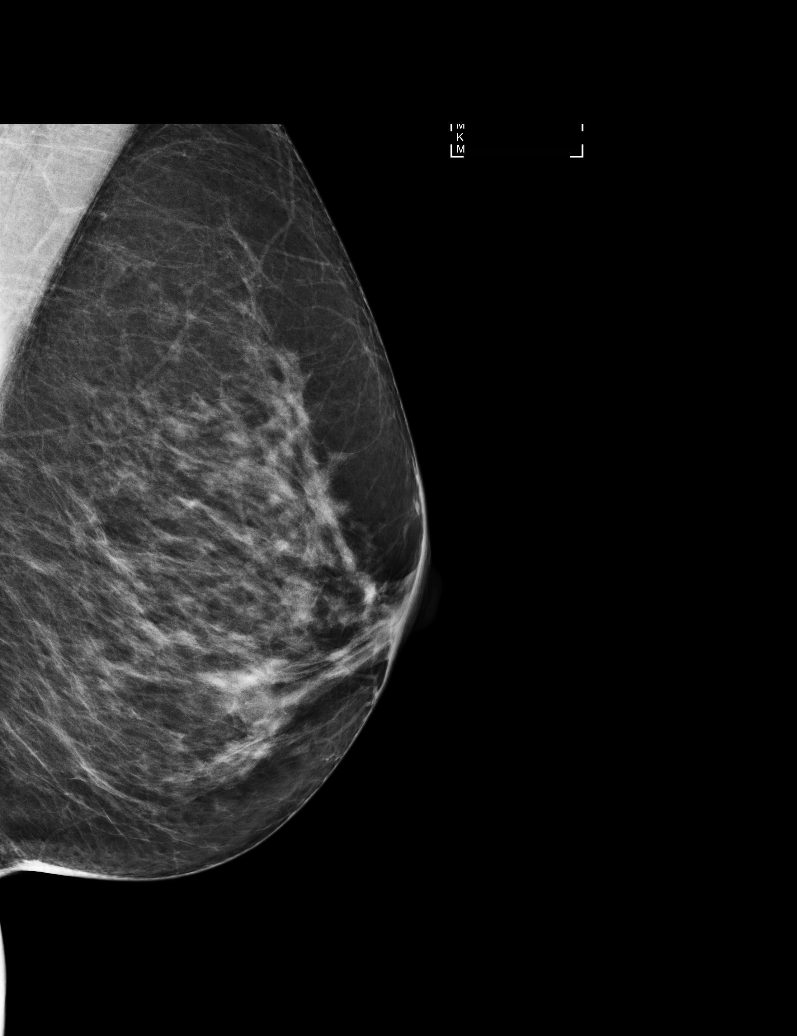

[R CC]
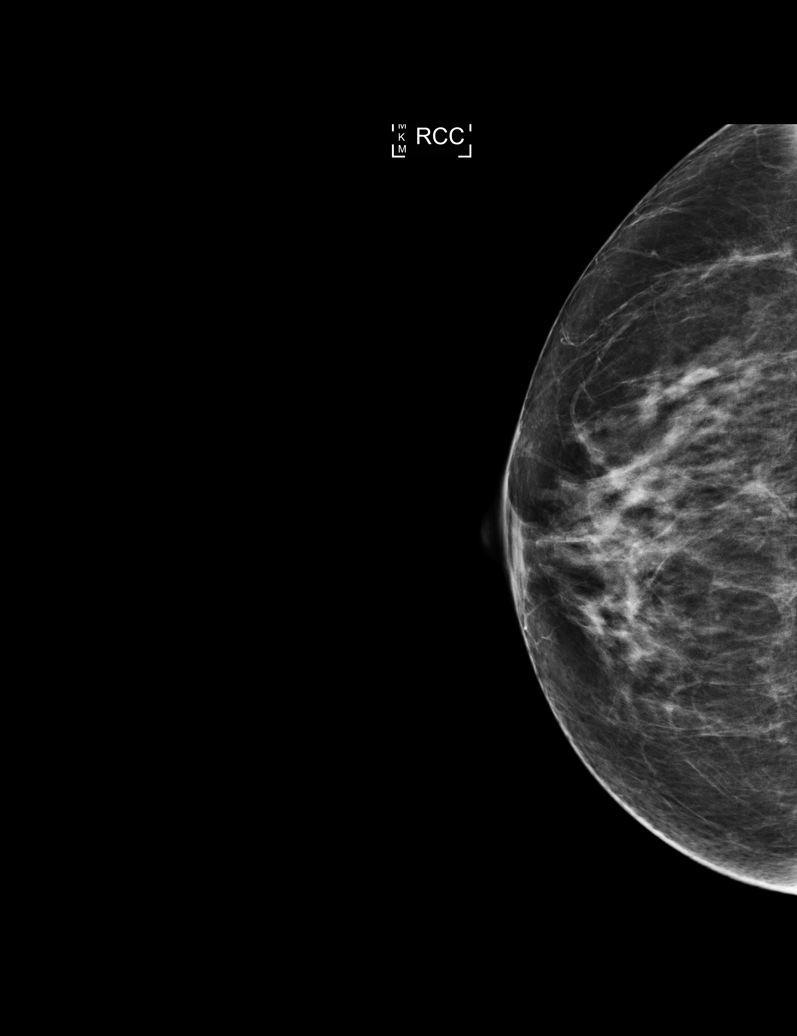

[R MLO]
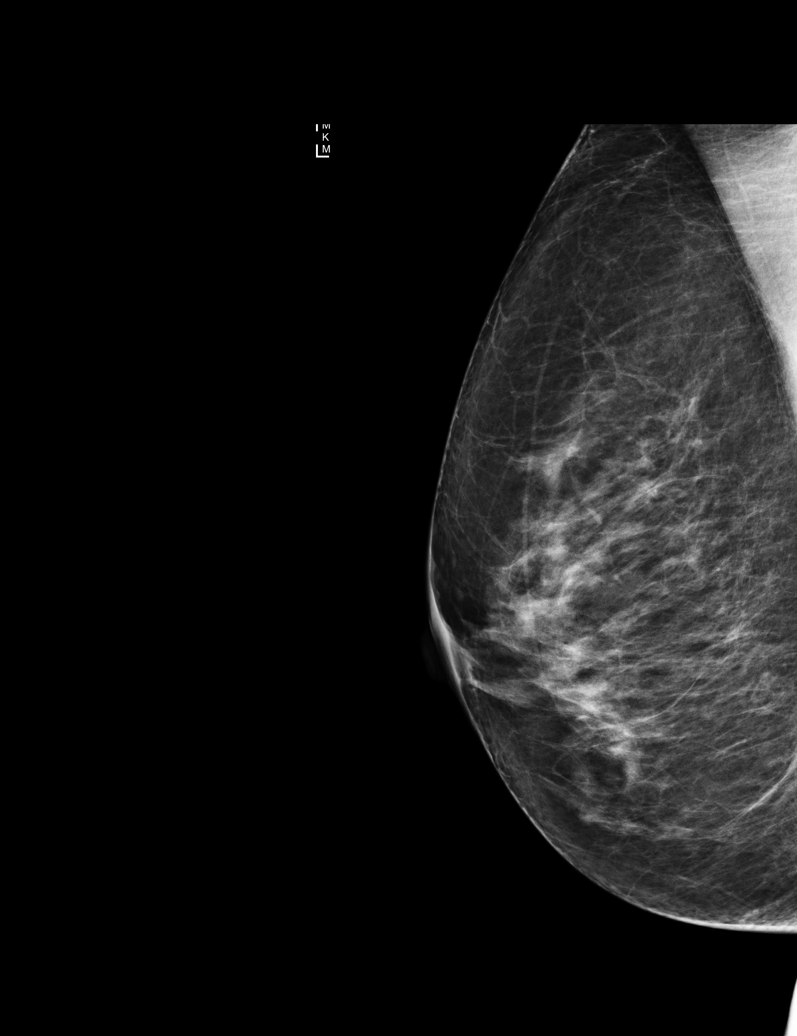

[L MLO (3 of 3)]
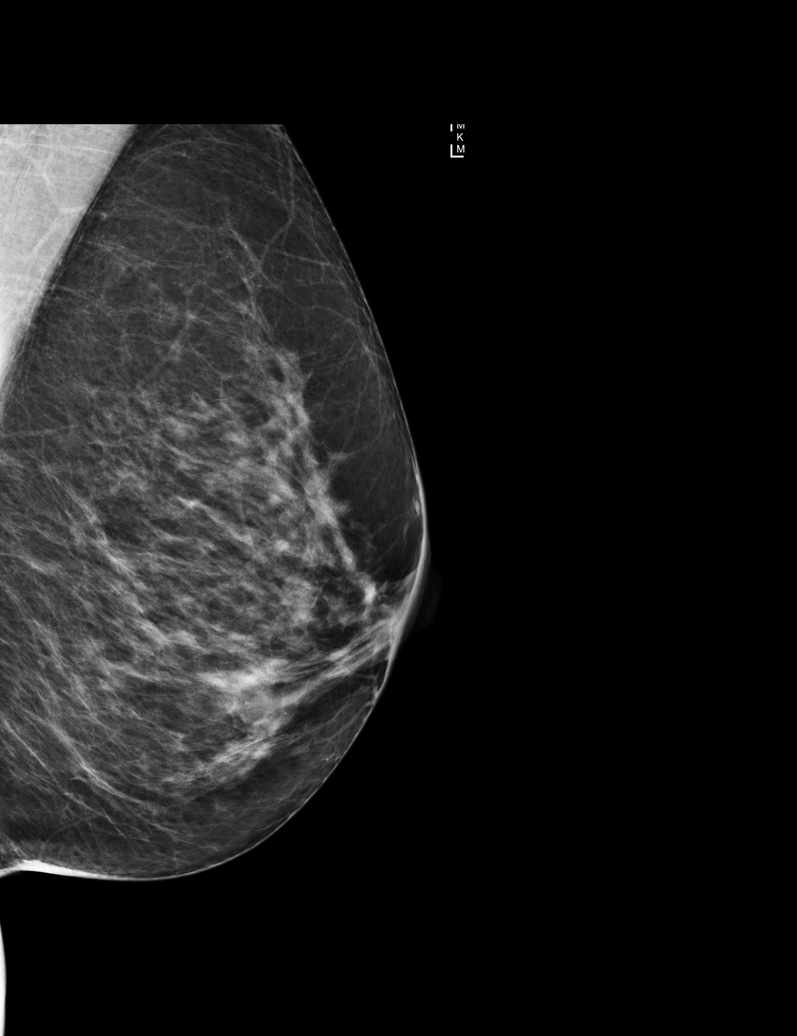

[6 of 6 positions shown; findings below may reference images not displayed]

ACR Breast Density Category c: The breast tissue is heterogeneously
dense, which may obscure small masses.
FINDINGS: There are no findings suspicious for malignancy. Images were
processed with CAD.
IMPRESSION: No mammographic evidence of malignancy. A result letter of this
screening mammogram will be mailed directly to the patient.

RECOMMENDATION:
Screening mammogram in one year. (Code:[id number])

BI-RADS CATEGORY  1: Negative.

## 2022-10-08 ENCOUNTER — Ambulatory Visit: Payer: 59 | Admitting: Internal Medicine

## 2022-10-08 ENCOUNTER — Encounter: Payer: Self-pay | Admitting: Internal Medicine

## 2022-10-08 VITALS — BP 128/80 | HR 73 | Temp 98.1°F | Resp 16 | Ht 62.0 in | Wt 137.0 lb

## 2022-10-08 DIAGNOSIS — E7841 Elevated Lipoprotein(a): Secondary | ICD-10-CM

## 2022-10-08 DIAGNOSIS — I10 Essential (primary) hypertension: Secondary | ICD-10-CM

## 2022-10-08 DIAGNOSIS — Z0000 Encounter for general adult medical examination without abnormal findings: Secondary | ICD-10-CM | POA: Diagnosis not present

## 2022-10-08 DIAGNOSIS — Z124 Encounter for screening for malignant neoplasm of cervix: Secondary | ICD-10-CM

## 2022-10-08 DIAGNOSIS — E785 Hyperlipidemia, unspecified: Secondary | ICD-10-CM | POA: Diagnosis not present

## 2022-10-08 DIAGNOSIS — D508 Other iron deficiency anemias: Secondary | ICD-10-CM

## 2022-10-08 DIAGNOSIS — Z0001 Encounter for general adult medical examination with abnormal findings: Secondary | ICD-10-CM | POA: Insufficient documentation

## 2022-10-08 LAB — LIPID PANEL
Cholesterol: 216 mg/dL — ABNORMAL HIGH (ref 0–200)
HDL: 54.3 mg/dL (ref >39.00)
LDL Cholesterol: 143 mg/dL — ABNORMAL HIGH (ref 0–99)
NonHDL: 161.86
Total CHOL/HDL Ratio: 4
Triglycerides: 94 mg/dL (ref 0.0–149.0)
VLDL: 18.8 mg/dL (ref 0.0–40.0)

## 2022-10-08 LAB — CBC WITH DIFFERENTIAL/PLATELET
Basophils Absolute: 0 10*3/uL (ref 0.0–0.1)
Basophils Relative: 0.3 % (ref 0.0–3.0)
Eosinophils Absolute: 0 10*3/uL (ref 0.0–0.7)
Eosinophils Relative: 0.6 % (ref 0.0–5.0)
HCT: 38.3 % (ref 36.0–46.0)
Hemoglobin: 12.5 g/dL (ref 12.0–15.0)
Lymphocytes Relative: 23.4 % (ref 12.0–46.0)
Lymphs Abs: 1.7 10*3/uL (ref 0.7–4.0)
MCHC: 32.5 g/dL (ref 30.0–36.0)
MCV: 87.7 fl (ref 78.0–100.0)
Monocytes Absolute: 0.5 10*3/uL (ref 0.1–1.0)
Monocytes Relative: 6.7 % (ref 3.0–12.0)
Neutro Abs: 4.9 10*3/uL (ref 1.4–7.7)
Neutrophils Relative %: 69 % (ref 43.0–77.0)
Platelets: 188 10*3/uL (ref 150.0–400.0)
RBC: 4.37 Mil/uL (ref 3.87–5.11)
RDW: 14.8 % (ref 11.5–15.5)
WBC: 7.2 10*3/uL (ref 4.0–10.5)

## 2022-10-08 LAB — HEPATIC FUNCTION PANEL
ALT: 12 U/L (ref 0–35)
AST: 13 U/L (ref 0–37)
Albumin: 4.3 g/dL (ref 3.5–5.2)
Alkaline Phosphatase: 89 U/L (ref 39–117)
Bilirubin, Direct: 0.1 mg/dL (ref 0.0–0.3)
Total Bilirubin: 0.5 mg/dL (ref 0.2–1.2)
Total Protein: 7.6 g/dL (ref 6.0–8.3)

## 2022-10-08 MED ORDER — NEBIVOLOL HCL 10 MG PO TABS
10.0000 mg | ORAL_TABLET | Freq: Every day | ORAL | 1 refills | Status: DC
Start: 2022-10-08 — End: 2023-06-04

## 2022-10-08 NOTE — Progress Notes (Signed)
Subjective:  Patient ID: Tracy Walton, female    DOB: [birth year]  Age: 57 y.o. MRN: [id number]  CC: Annual Exam, Anemia, Hyperlipidemia, and Hypertension   HPI Tracy Walton presents for a CPX and f/up -----  Discussed the use of AI scribe software for clinical note transcription with the patient, who gave verbal consent to proceed.  History of Present Illness   The patient, a 57 year old with a history of hypertension managed with Nibivolol, reports feeling well with no current symptoms. She has recently recovered from a respiratory illness, with no ongoing cough, wheezing, or shortness of breath. She denies any side effects from her antihypertensive medication, including dizziness, lightheadedness, palpitations, or lower extremity edema.  The patient has been attempting to maintain physical fitness through aerobics, albeit at a slower pace due to age. During these exercises, she denies experiencing chest pain, shortness of breath, dizziness, or lightheadedness.  Regarding gynecological health, the patient's last Pap smear was reported as 'a while ago,' with no current symptoms of vaginal discharge or bleeding.       Outpatient Medications Prior to Visit  Medication Sig Dispense Refill   Cholecalciferol 50 MCG (2000 UT) TABS Take 2 tablets (4,000 Units total) by mouth daily. 180 tablet 1   Ferric Maltol (ACCRUFER) 30 MG CAPS Take 1 capsule (30 mg total) by mouth in the morning and at bedtime. 180 capsule 0   potassium chloride (KLOR-CON) 10 MEQ tablet Take 1 tablet (10 mEq total) by mouth 2 (two) times daily. 180 tablet 1   zinc gluconate 50 MG tablet Take 1 tablet (50 mg total) by mouth daily. 90 tablet 1   nebivolol (BYSTOLIC) 10 MG tablet Take 1 tablet (10 mg total) by mouth daily. 90 tablet 0   No facility-administered medications prior to visit.    ROS Review of Systems  Constitutional: Negative.  Negative for chills, diaphoresis, fatigue and fever.  HENT: Negative.     Eyes: Negative.   Respiratory: Negative.  Negative for cough, chest tightness, shortness of breath and wheezing.   Cardiovascular:  Negative for chest pain, palpitations and leg swelling.  Gastrointestinal:  Negative for abdominal pain, constipation, diarrhea, nausea and vomiting.  Genitourinary: Negative.  Negative for difficulty urinating.  Musculoskeletal: Negative.  Negative for arthralgias and myalgias.  Skin: Negative.   Neurological: Negative.  Negative for dizziness and weakness.  Hematological:  Negative for adenopathy. Does not bruise/bleed easily.  Psychiatric/Behavioral: Negative.      Objective:  BP 128/80 (BP Location: Right Arm, Patient Position: Sitting, Cuff Size: Large)   Pulse 73   Temp 98.1 F (36.7 C) (Oral)   Resp 16   Ht 5\' 2"  (1.575 m)   Wt 137 lb (62.1 kg)   SpO2 97%   BMI 25.06 kg/m   BP Readings from Last 3 Encounters:  10/08/22 128/80  07/08/22 134/72  05/06/22 118/68    Wt Readings from Last 3 Encounters:  10/08/22 137 lb (62.1 kg)  07/08/22 133 lb (60.3 kg)  05/06/22 132 lb (59.9 kg)    Physical Exam Vitals reviewed.  HENT:     Nose: Nose normal.     Mouth/Throat:     Mouth: Mucous membranes are moist.  Eyes:     General: No scleral icterus.    Conjunctiva/sclera: Conjunctivae normal.  Cardiovascular:     Rate and Rhythm: Normal rate and regular rhythm.     Heart sounds: No murmur heard. Pulmonary:     Effort: Pulmonary effort is normal.  Breath sounds: No stridor. No wheezing, rhonchi or rales.  Abdominal:     General: Abdomen is flat.     Palpations: There is no mass.     Tenderness: There is no abdominal tenderness. There is no guarding.     Hernia: No hernia is present.  Musculoskeletal:        General: Normal range of motion.     Cervical back: Neck supple.     Right lower leg: No edema.     Left lower leg: No edema.  Lymphadenopathy:     Cervical: No cervical adenopathy.  Skin:    General: Skin is warm and dry.   Neurological:     General: No focal deficit present.     Mental Status: She is alert. Mental status is at baseline.  Psychiatric:        Mood and Affect: Mood normal.        Behavior: Behavior normal.     Lab Results  Component Value Date   WBC 7.2 10/08/2022   HGB 12.5 10/08/2022   HCT 38.3 10/08/2022   PLT 188.0 10/08/2022   GLUCOSE 91 05/06/2022   CHOL 216 (H) 10/08/2022   TRIG 94.0 10/08/2022   HDL 54.30 10/08/2022   LDLCALC 143 (H) 10/08/2022   ALT 12 10/08/2022   AST 13 10/08/2022   NA 139 05/06/2022   K 3.5 05/06/2022   CL 99 05/06/2022   CREATININE 0.77 05/06/2022   BUN 10 05/06/2022   CO2 35 (H) 05/06/2022   TSH 1.267 04/20/2022    No results found.  Assessment & Plan:   Dyslipidemia, goal LDL below 130- Statin is not yet indicated. 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 -     CBC with Differential/Platelet; Future  Screening for cervical cancer -     Ambulatory referral to Gynecology  Encounter for general adult medical examination with abnormal findings - Exam completed, labs reviewed, vaccines reviewed and updated, cancer screenings addressed, pt ed material was given.   Essential hypertension- Her blood pressure is well-controlled. -     Nebivolol HCl; Take 1 tablet (10 mg total) by mouth daily.  Dispense: 90 tablet; Refill: 1  High serum lipoprotein(a)- Will evaluate for CAD with a CCS. -     CT CARDIAC SCORING (SELF PAY ONLY); Future     Follow-up: Return in about 6 months (around 04/10/2023).  Sanda Linger, MD

## 2022-10-08 NOTE — Patient Instructions (Signed)

## 2022-10-11 DIAGNOSIS — E7841 Elevated Lipoprotein(a): Secondary | ICD-10-CM | POA: Insufficient documentation

## 2022-11-07 ENCOUNTER — Other Ambulatory Visit: Payer: Self-pay | Admitting: Internal Medicine

## 2022-11-07 DIAGNOSIS — I10 Essential (primary) hypertension: Secondary | ICD-10-CM

## 2022-11-07 DIAGNOSIS — T502X5A Adverse effect of carbonic-anhydrase inhibitors, benzothiadiazides and other diuretics, initial encounter: Secondary | ICD-10-CM

## 2023-01-31 ENCOUNTER — Other Ambulatory Visit: Payer: Self-pay | Admitting: Internal Medicine

## 2023-01-31 DIAGNOSIS — I10 Essential (primary) hypertension: Secondary | ICD-10-CM

## 2023-06-04 ENCOUNTER — Encounter: Payer: Self-pay | Admitting: Internal Medicine

## 2023-06-04 ENCOUNTER — Ambulatory Visit (INDEPENDENT_AMBULATORY_CARE_PROVIDER_SITE_OTHER): Payer: Self-pay | Admitting: Internal Medicine

## 2023-06-04 VITALS — BP 142/78 | HR 66 | Temp 98.4°F | Resp 16 | Ht 62.0 in | Wt 139.0 lb

## 2023-06-04 DIAGNOSIS — R001 Bradycardia, unspecified: Secondary | ICD-10-CM | POA: Insufficient documentation

## 2023-06-04 DIAGNOSIS — Z124 Encounter for screening for malignant neoplasm of cervix: Secondary | ICD-10-CM

## 2023-06-04 DIAGNOSIS — Z1231 Encounter for screening mammogram for malignant neoplasm of breast: Secondary | ICD-10-CM | POA: Insufficient documentation

## 2023-06-04 DIAGNOSIS — D538 Other specified nutritional anemias: Secondary | ICD-10-CM

## 2023-06-04 DIAGNOSIS — E785 Hyperlipidemia, unspecified: Secondary | ICD-10-CM

## 2023-06-04 DIAGNOSIS — I10 Essential (primary) hypertension: Secondary | ICD-10-CM | POA: Insufficient documentation

## 2023-06-04 LAB — CBC WITH DIFFERENTIAL/PLATELET
Basophils Absolute: 0 10*3/uL (ref 0.0–0.1)
Basophils Relative: 0.3 % (ref 0.0–3.0)
Eosinophils Absolute: 0.1 10*3/uL (ref 0.0–0.7)
Eosinophils Relative: 0.8 % (ref 0.0–5.0)
HCT: 38.1 % (ref 36.0–46.0)
Hemoglobin: 12.7 g/dL (ref 12.0–15.0)
Lymphocytes Relative: 25.4 % (ref 12.0–46.0)
Lymphs Abs: 1.8 10*3/uL (ref 0.7–4.0)
MCHC: 33.3 g/dL (ref 30.0–36.0)
MCV: 88.3 fl (ref 78.0–100.0)
Monocytes Absolute: 0.9 10*3/uL (ref 0.1–1.0)
Monocytes Relative: 12.4 % — ABNORMAL HIGH (ref 3.0–12.0)
Neutro Abs: 4.2 10*3/uL (ref 1.4–7.7)
Neutrophils Relative %: 61.1 % (ref 43.0–77.0)
Platelets: 211 10*3/uL (ref 150.0–400.0)
RBC: 4.32 Mil/uL (ref 3.87–5.11)
RDW: 14.5 % (ref 11.5–15.5)
WBC: 6.9 10*3/uL (ref 4.0–10.5)

## 2023-06-04 LAB — BASIC METABOLIC PANEL WITH GFR
BUN: 12 mg/dL (ref 6–23)
CO2: 30 meq/L (ref 19–32)
Calcium: 9.6 mg/dL (ref 8.4–10.5)
Chloride: 103 meq/L (ref 96–112)
Creatinine, Ser: 0.93 mg/dL (ref 0.40–1.20)
GFR: 68.86 mL/min (ref >60.00)
Glucose, Bld: 85 mg/dL (ref 70–99)
Potassium: 3.9 meq/L (ref 3.5–5.1)
Sodium: 139 meq/L (ref 135–145)

## 2023-06-04 LAB — TSH: TSH: 1.28 u[IU]/mL (ref 0.35–5.50)

## 2023-06-04 MED ORDER — AMLODIPINE BESYLATE 5 MG PO TABS: 5.0000 mg | ORAL_TABLET | Freq: Every day | ORAL | 1 refills | Status: DC

## 2023-06-04 NOTE — Progress Notes (Unsigned)
 Subjective:  Patient ID: Tracy Walton, female    DOB: 10-02-1965  Age: 58 y.o. MRN: [id number]  CC: Hypertension   HPI Tracy Walton presents for f/up ----  Discussed the use of AI scribe software for clinical note transcription with the patient, who gave verbal consent to proceed.  History of Present Illness   Tracy Walton is a 58 year old female with hypertension who presents for a six-month follow-up visit.  She feels well overall with no headache, blurred vision, dizziness, or lightheadedness. Her blood pressure readings during the visit were slightly elevated. She is currently taking nebivolol, which she has run out of, potassium (one tablet daily), vitamin D3 (4000 IU daily), and zinc (50 mg daily). She will run out of potassium in the next six months.  No symptoms of anemia such as weakness, fatigue, dizziness, or lightheadedness. No chest pain, shortness of breath, dizziness, or lightheadedness during physical activity, which includes walking approximately an eighth of a mile.       Outpatient Medications Prior to Visit  Medication Sig Dispense Refill   Cholecalciferol 50 MCG (2000 UT) TABS Take 2 tablets (4,000 Units total) by mouth daily. 180 tablet 1   Ferric Maltol (ACCRUFER) 30 MG CAPS Take 1 capsule (30 mg total) by mouth in the morning and at bedtime. 180 capsule 0   potassium chloride (KLOR-CON) 10 MEQ tablet TAKE 1 TABLET(10 MEQ) BY MOUTH TWICE DAILY 180 tablet 1   zinc gluconate 50 MG tablet Take 1 tablet (50 mg total) by mouth daily. 90 tablet 1   nebivolol (BYSTOLIC) 10 MG tablet Take 1 tablet (10 mg total) by mouth daily. 90 tablet 1   No facility-administered medications prior to visit.    ROS Review of Systems  Constitutional: Negative.  Negative for appetite change, diaphoresis and fatigue.  HENT: Negative.    Respiratory: Negative.  Negative for chest tightness, shortness of breath and wheezing.   Cardiovascular:  Negative for chest pain, palpitations  and leg swelling.  Gastrointestinal: Negative.  Negative for abdominal pain, diarrhea, nausea and vomiting.  Endocrine: Negative.   Genitourinary: Negative.  Negative for difficulty urinating.  Musculoskeletal: Negative.  Negative for arthralgias.  Skin: Negative.   Neurological:  Negative for dizziness and light-headedness.  Hematological:  Negative for adenopathy. Does not bruise/bleed easily.  Psychiatric/Behavioral: Negative.      Objective:  BP (!) 142/78 (BP Location: Left Arm, Patient Position: Sitting, Cuff Size: Normal) Comment: BP (R) 138/76  Pulse 66   Temp 98.4 F (36.9 C) (Oral)   Resp 16   Ht 5\' 2"  (1.575 m)   Wt 139 lb (63 kg)   SpO2 98%   BMI 25.42 kg/m   BP Readings from Last 3 Encounters:  06/04/23 (!) 142/78  10/08/22 128/80  07/08/22 134/72    Wt Readings from Last 3 Encounters:  06/04/23 139 lb (63 kg)  10/08/22 137 lb (62.1 kg)  07/08/22 133 lb (60.3 kg)    Physical Exam Vitals reviewed.  Constitutional:      Appearance: Normal appearance.  HENT:     Mouth/Throat:     Mouth: Mucous membranes are moist.  Eyes:     General: No scleral icterus.    Conjunctiva/sclera: Conjunctivae normal.  Cardiovascular:     Rate and Rhythm: Regular rhythm. Bradycardia present.     Heart sounds: No murmur heard.    No friction rub. No gallop.     Comments: EKG- SB, 58 bpm No LVH, Q waves, or ST/T  wave changes  Pulmonary:     Effort: Pulmonary effort is normal.     Breath sounds: No stridor. No wheezing, rhonchi or rales.  Abdominal:     General: Abdomen is flat.     Palpations: There is no mass.     Tenderness: There is no abdominal tenderness. There is no guarding.     Hernia: No hernia is present.  Musculoskeletal:     Cervical back: Neck supple.     Right lower leg: No edema.     Left lower leg: No edema.  Skin:    General: Skin is warm and dry.  Neurological:     General: No focal deficit present.     Mental Status: She is alert. Mental status  is at baseline.  Psychiatric:        Mood and Affect: Mood normal.        Behavior: Behavior normal.     Lab Results  Component Value Date   WBC 6.9 06/04/2023   HGB 12.7 06/04/2023   HCT 38.1 06/04/2023   PLT 211.0 06/04/2023   GLUCOSE 85 06/04/2023   CHOL 216 (H) 10/08/2022   TRIG 94.0 10/08/2022   HDL 54.30 10/08/2022   LDLCALC 143 (H) 10/08/2022   ALT 12 10/08/2022   AST 13 10/08/2022   NA 139 06/04/2023   K 3.9 06/04/2023   CL 103 06/04/2023   CREATININE 0.93 06/04/2023   BUN 12 06/04/2023   CO2 30 06/04/2023   TSH 1.28 06/04/2023    No results found.  Assessment & Plan:  Anemia due to zinc deficiency -     CBC with Differential/Platelet; Future  Dyslipidemia, goal LDL below 130 -     TSH; Future  Primary hypertension -     Basic metabolic panel with GFR; Future -     CBC with Differential/Platelet; Future -     TSH; Future -     amLODIPine Besylate; Take 1 tablet (5 mg total) by mouth daily.  Dispense: 90 tablet; Refill: 1 -     AMB Referral [facility]BCI Care Management  Bradycardia on ECG -     TSH; Future  Screening for cervical cancer -     Ambulatory referral to Gynecology  Screening mammogram for breast cancer -     Digital Screening Mammogram, Left and Right; Future     Follow-up: Return in about 6 months (around 12/04/2023).  Sanda Linger, MD

## 2023-06-04 NOTE — Patient Instructions (Signed)
 Bradycardia, Adult Bradycardia is a slower-than-normal heartbeat. A normal resting heart rate for an adult ranges from 60 to 100 beats per minute. With bradycardia, the resting heart rate is less than 60 beats per minute. Bradycardia can prevent enough oxygen from reaching certain areas of your body when you are active. It can be serious if it keeps enough oxygen from reaching your brain and other parts of your body. Bradycardia is not a problem for everyone. For some healthy adults, a slow resting heart rate is normal. What are the causes? This condition may be caused by: A problem with the heart, including: A problem with the heart's electrical system, such as a heart block. With a heart block, electrical signals between the chambers of the heart are partially or completely blocked, so they are not able to work as they should. A problem with the heart's natural pacemaker (sinus node). Heart disease. A heart attack. Heart damage. Lyme disease. A heart infection. A heart condition that is present at birth (congenital heart defect). Certain medicines that treat heart conditions. Certain conditions, such as hypothyroidism and obstructive sleep apnea. Problems with the balance of chemicals and other substances, like potassium, in the blood. Trauma. Radiation therapy. What increases the risk? You are more likely to develop this condition if you: Are age 51 or older. Have high blood pressure (hypertension), high cholesterol (hyperlipidemia), or diabetes. Drink heavily, use tobacco or nicotine products, or use drugs. What are the signs or symptoms? Symptoms of this condition include: Light-headedness. Feeling faint or fainting. Fatigue and weakness. Trouble with activity or exercise. Shortness of breath. Chest pain (angina). Drowsiness. Confusion. Dizziness. How is this diagnosed? This condition may be diagnosed based on: Your symptoms. Your medical history. A physical exam. During  the exam, your health care provider will listen to your heartbeat and check your pulse. To confirm the diagnosis, your health care provider may order tests, such as: Blood tests. An electrocardiogram (ECG). This test records the heart's electrical activity. The test can show how fast your heart is beating and whether the heartbeat is steady. A test in which you wear a portable device (event recorder or Holter monitor) to record your heart's electrical activity while you go about your day. An exercise test. How is this treated? Treatment for this condition depends on the cause of the condition and how severe your symptoms are. Treatment may involve: Treatment of the underlying condition. Changing your medicines or how much medicine you take. Having a small, battery-operated device called a pacemaker implanted under the skin. When bradycardia occurs, this device can be used to increase your heart rate and help your heart beat in a regular rhythm. Follow these instructions at home: Lifestyle Manage any health conditions that contribute to bradycardia as told by your health care provider. Follow a heart-healthy diet. A nutrition specialist (dietitian) can help educate you about healthy food options and changes. Follow an exercise program that is approved by your health care provider. Maintain a healthy weight. Try to reduce or manage your stress, such as with yoga or meditation. If you need help reducing stress, ask your health care provider. Do not use any products that contain nicotine or tobacco. These products include cigarettes, chewing tobacco, and vaping devices, such as e-cigarettes. If you need help quitting, ask your health care provider. Do not use illegal drugs. Alcohol use If you drink alcohol: Limit how much you have to: 0-1 drink a day for women who are not pregnant. 0-2 drinks a day  for men. Know how much alcohol is in a drink. In the [place] one drink equals one 12 oz bottle of  beer (355 mL), one 5 oz glass of wine (148 mL), or one 1 oz glass of hard liquor (44 mL). General instructions Take over-the-counter and prescription medicines only as told by your health care provider. Keep all follow-up visits. This is important. How is this prevented? In some cases, bradycardia may be prevented by: Treating underlying medical problems. Stopping behaviors or medicines that can trigger the condition. Contact a health care provider if: You feel light-headed or dizzy. You almost faint. You feel weak or are easily fatigued during physical activity. You experience confusion or have memory problems. Get help right away if: You faint. You have chest pains or an irregular heartbeat (palpitations). You have trouble breathing. These symptoms may represent a serious problem that is an emergency. Do not wait to see if the symptoms will go away. Get medical help right away. Call your local emergency services (911 in the [place] Do not drive yourself to the hospital. Summary Bradycardia is a slower-than-normal heartbeat. With bradycardia, the resting heart rate is less than 60 beats per minute. Treatment for this condition depends on the cause. Manage any health conditions that contribute to bradycardia as told by your health care provider. Do not use any products that contain nicotine or tobacco. These products include cigarettes, chewing tobacco, and vaping devices, such as e-cigarettes. Keep all follow-up visits. This is important. This information is not intended to replace advice given to you by your health care provider. Make sure you discuss any questions you have with your health care provider. Document Revised: 06/11/2020 Document Reviewed: 06/11/2020 Elsevier Patient Education  2024 [employer].

## 2023-06-05 ENCOUNTER — Encounter: Payer: Self-pay | Admitting: Internal Medicine

## 2023-06-05 NOTE — Addendum Note (Signed)
 Addended by: Daryll Brod on: 06/05/2023 04:26 PM   Modules accepted: Orders

## 2023-06-06 ENCOUNTER — Telehealth: Payer: Self-pay | Admitting: *Deleted

## 2023-06-06 NOTE — Progress Notes (Signed)
 Care Guide Pharmacy Note  06/06/2023 Name: Tracy Walton MRN: [id number] DOB: 02-21-1966  Referred By: Etta Grandchild, MD Reason for referral: Care Coordination (Outreach to schedule referral with pharmacist )   Tracy Walton is a 58 y.o. year old female who is a primary care patient of Etta Grandchild, MD.  Tracy Walton was referred to the pharmacist for assistance related to: HTN  Successful contact was made with the patient to discuss pharmacy services including being ready for the pharmacist to call at least 5 minutes before the scheduled appointment time and to have medication bottles and any blood pressure readings ready for review. The patient agreed to meet with the pharmacist via telephone visit on 06/23/2023  Tracy Walton, CMA [place]  [facility], [facility] Guide Direct Dial: [phone]  Fax: [phone] Website: [email or web address]

## 2023-06-19 ENCOUNTER — Ambulatory Visit: Admitting: Internal Medicine

## 2023-06-23 ENCOUNTER — Other Ambulatory Visit: Payer: Self-pay | Admitting: Pharmacist

## 2023-06-23 DIAGNOSIS — E876 Hypokalemia: Secondary | ICD-10-CM

## 2023-06-23 DIAGNOSIS — D539 Nutritional anemia, unspecified: Secondary | ICD-10-CM

## 2023-06-23 DIAGNOSIS — I10 Essential (primary) hypertension: Secondary | ICD-10-CM

## 2023-06-23 MED ORDER — POTASSIUM CHLORIDE ER 10 MEQ PO TBCR: 10.0000 meq | EXTENDED_RELEASE_TABLET | Freq: Two times a day (BID) | ORAL | 1 refills | Status: DC

## 2023-06-23 MED ORDER — IRON (FERROUS SULFATE) 325 (65 FE) MG PO TABS: 325.0000 mg | ORAL_TABLET | Freq: Every day | ORAL | Status: AC

## 2023-06-23 NOTE — Progress Notes (Signed)
 06/23/2023 Name: Tracy Walton MRN: [id number] DOB: [birth year]  Chief Complaint  Patient presents with   Hypertension   Medication Management    Tracy Walton is a 58 y.o. year old female who presented for a telephone visit.   They were referred to the pharmacist by their PCP for assistance in managing hypertension.   Subjective:  Care Team: Primary Care Provider: Arcadio Knuckles, MD ; Next Scheduled Visit: 12/04/23   Medication Access/Adherence  Current Pharmacy:  [id number] [place], [place] - [address] AT [facility] [address] [facility] [address] [place] [address] Phone: [phone] Fax: [phone]  [facility] [place] - [id number] [phone] [address] [place] [address] Phone: [phone] Fax: [phone]   Patient reports affordability concerns with their medications: No  Patient reports access/transportation concerns to their pharmacy: No  Patient reports adherence concerns with their medications:  Yes    Pt notes she has not been taking Accrufer  because the pharmacy never has it available   Hypertension:  Current medications: none currently Pt notes she was taking nebivolol  at the time of PCP appt but has since stopped it. She has not started amlodipine  that was prescribed after 4/2 appt because she was not aware of it being started.  Patient does not have a validated, automated, upper arm home BP cuff. She has a wrist cuff. Current blood pressure readings: none   Objective:  No results found for: "HGBA1C"  Lab Results  Component Value Date   CREATININE 0.93 06/04/2023   BUN 12 06/04/2023   NA 139 06/04/2023   K 3.9 06/04/2023   CL 103 06/04/2023   CO2 30 06/04/2023    Lab Results  Component Value Date   CHOL 216 (H) 10/08/2022   HDL 54.30 10/08/2022   LDLCALC 143 (H) 10/08/2022   TRIG 94.0 10/08/2022   CHOLHDL 4 10/08/2022    Medications Reviewed Today     Reviewed by  Dion Frankel, RPH (Pharmacist) on 06/23/23 at [phone]  Med List Status: <None>   Medication Order Taking? Sig Documenting Provider Last Dose Status Informant  amLODipine  (NORVASC ) 5 MG tablet [id number] No Take 1 tablet (5 mg total) by mouth daily.  Patient not taking: Reported on 06/23/2023   Arcadio Knuckles, MD Not Taking Active   Cholecalciferol  50 MCG (2000 UT) TABS [id number] Yes Take 2 tablets (4,000 Units total) by mouth daily. Arcadio Knuckles, MD Taking Active Self  Ferric Maltol  (ACCRUFER ) 30 MG CAPS [id number] No Take 1 capsule (30 mg total) by mouth in the morning and at bedtime.  Patient not taking: Reported on 06/23/2023   Arcadio Knuckles, MD Not Taking Active   potassium chloride  (KLOR-CON ) 10 MEQ tablet [id number] Yes TAKE 1 TABLET(10 MEQ) BY MOUTH TWICE DAILY Arcadio Knuckles, MD Taking Active   zinc  gluconate 50 MG tablet [id number]  Take 1 tablet (50 mg total) by mouth daily. Arcadio Knuckles, MD  Active               Assessment/Plan:   Hypertension: - Currently uncontrolled, BP goal <130/80 - Reviewed long term cardiovascular and renal outcomes of uncontrolled blood pressure - Reviewed dietary recommendations - Reviewed appropriate blood pressure monitoring technique and reviewed goal blood pressure. Recommended to check home blood pressure and heart rate daily. Recommended arm monitor instead of wrist for better accuracy. - Recommend to start amlodipine  5 mg  daily and monitor BP.  - advised to call if she is having numbers >130/80  - Sent potassium refill   Recommended trial of OTC ferrous sulfate  325 mg daily to see if she can tolerate since she has been unable to get Accrufer   Follow Up Plan: PRN  Rainelle Bur, PharmD, BCPS, CPP Clinical Pharmacist Practitioner [facility] [phone]

## 2023-06-23 NOTE — Patient Instructions (Signed)
 It was a pleasure speaking with you today!  Start taking amlodipine  5 mg daily and monitor your blood pressure at home. Notify us  if your blood pressure is regularly above 130/80.  Try over the counter ferrous sulfate  325 mg 1 tablet daily to see if you can tolerate it.  Feel free to call with any questions or concerns!  Rainelle Bur, PharmD, BCPS, CPP Clinical Pharmacist Practitioner [facility] [phone]

## 2023-07-24 ENCOUNTER — Telehealth: Payer: Self-pay | Admitting: Internal Medicine

## 2023-07-24 NOTE — Telephone Encounter (Signed)
 Caller & Relationship to patient: Self  Call back number: [phone]   Date of last office visit: 4.2.25  Date of next office visit: 10.2.25  Medication(s) to be refilled:  amLODipine  (NORVASC ) 5 MG tablet  [facility] #[id number]  Phone: [phone]  Fax: [phone]   Iron , Ferrous Sulfate , 325 (65 Fe) MG TABS  [facility] [place] Phone: [phone]  Fax: [phone]   Pt ios requesting we send rx to different pharmacies, including the OTC med.

## 2023-07-25 NOTE — Telephone Encounter (Signed)
 Unable to reach patient. LMTRC. Patient just received a 6 MONTH supply of her amlodipine  06/08/2023

## 2023-07-25 NOTE — Telephone Encounter (Signed)
 Pt called back and was advised of her refills and recommended that she call her pharmacy.

## 2023-08-04 NOTE — Telephone Encounter (Signed)
 Copied from CRM [id number]. Topic: General - Other >> Aug 04, 2023  2:31 PM Kita Perish H wrote: Reason for CRM: Patient returning call to [facility] regarding medication, states she did receive the amplodipine but hasn't gotte the Iron , Ferrous Sulfate , 325 (65 Fe) MG TABS. Wants to know if Domenick Friedlander is familiar with the [facility] may have it filled there, Domenick Friedlander can call her when she gets a chance.  Johnesha [phone]

## 2023-08-06 ENCOUNTER — Other Ambulatory Visit: Payer: Self-pay

## 2023-08-06 DIAGNOSIS — D539 Nutritional anemia, unspecified: Secondary | ICD-10-CM

## 2023-08-06 NOTE — Telephone Encounter (Signed)
 This medication was sent in as OTC. I explained that to the patient and she gave a verbal understanding.

## 2023-10-08 ENCOUNTER — Other Ambulatory Visit: Payer: Self-pay | Admitting: Internal Medicine

## 2023-10-08 DIAGNOSIS — E876 Hypokalemia: Secondary | ICD-10-CM

## 2023-10-08 DIAGNOSIS — I10 Essential (primary) hypertension: Secondary | ICD-10-CM

## 2023-10-08 NOTE — Telephone Encounter (Signed)
 Copied from CRM #[id number]. Topic: Clinical - Medication Refill >> Oct 08, 2023  9:46 AM [place] J wrote: Medication: amLODipine  (NORVASC ) 5 MG tablet  Has the patient contacted their pharmacy? Yes (Agent: If no, request that the patient contact the pharmacy for the refill. If patient does not wish to contact the pharmacy document the reason why and proceed with request.) (Agent: If yes, when and what did the pharmacy advise?) Pharmacy could not refill medication for patient.   This is the patient's preferred pharmacy:  [id number] [address], [place] - [address] AT [facility] [address] & [facility] [address] [place] [address] Phone: [phone] Fax: [phone]  Is this the correct pharmacy for this prescription? Yes If no, delete pharmacy and type the correct one.   Has the prescription been filled recently? No  Is the patient out of the medication? Yes  Has the patient been seen for an appointment in the last year OR does the patient have an upcoming appointment? Yes  Can we respond through MyChart? No  Agent: Please be advised that Rx refills may take up to 3 business days. We ask that you follow-up with your pharmacy.

## 2023-10-08 NOTE — Telephone Encounter (Signed)
 Copied from CRM [id number]. Topic: Clinical - Medication Refill >> Oct 08, 2023  9:50 AM [place] J wrote: Medication: potassium chloride  (KLOR-CON ) 10 MEQ tablet  Has the patient contacted their pharmacy? No (Agent: If no, request that the patient contact the pharmacy for the refill. If patient does not wish to contact the pharmacy document the reason why and proceed with request.) (Agent: If yes, when and what did the pharmacy advise?) Patient remembered that a refill was needed while refilling a different medication.  This is the patient's preferred pharmacy:  [id number] [address], [place] - [address] AT [facility] [address] & [facility] [address] [place] [address] Phone: [phone] Fax: [phone]  Is this the correct pharmacy for this prescription? Yes If no, delete pharmacy and type the correct one.   Has the prescription been filled recently? No  Is the patient out of the medication? No  Has the patient been seen for an appointment in the last year OR does the patient have an upcoming appointment? Yes  Can we respond through MyChart? No  Agent: Please be advised that Rx refills may take up to 3 business days. We ask that you follow-up with your pharmacy.

## 2023-10-13 MED ORDER — AMLODIPINE BESYLATE 5 MG PO TABS: 5.0000 mg | ORAL_TABLET | Freq: Every day | ORAL | 1 refills | Status: DC

## 2023-10-13 MED ORDER — POTASSIUM CHLORIDE ER 10 MEQ PO TBCR: 10.0000 meq | EXTENDED_RELEASE_TABLET | Freq: Two times a day (BID) | ORAL | 0 refills | Status: AC

## 2023-11-10 ENCOUNTER — Encounter: Payer: Self-pay | Admitting: Internal Medicine

## 2023-11-10 NOTE — Progress Notes (Unsigned)
    Subjective:    Patient ID: Tracy Walton, female    DOB: 1965-05-24, 58 y.o.   MRN: [id number]      HPI Tracy Walton is here for No chief complaint on file.   Was on bystolic  in past or spironolactone and stop k  No ace, arb     Medications and allergies reviewed with patient and updated if appropriate.  Current Outpatient Medications on File Prior to Visit  Medication Sig Dispense Refill   amLODipine  (NORVASC ) 5 MG tablet Take 1 tablet (5 mg total) by mouth daily. 90 tablet 1   Cholecalciferol  50 MCG (2000 UT) TABS Take 2 tablets (4,000 Units total) by mouth daily. 180 tablet 1   Iron , Ferrous Sulfate , 325 (65 Fe) MG TABS Take 325 mg by mouth daily.     potassium chloride  (KLOR-CON ) 10 MEQ tablet Take 1 tablet (10 mEq total) by mouth 2 (two) times daily. 180 tablet 0   zinc  gluconate 50 MG tablet Take 1 tablet (50 mg total) by mouth daily. 90 tablet 1   No current facility-administered medications on file prior to visit.    Review of Systems     Objective:  There were no vitals filed for this visit. BP Readings from Last 3 Encounters:  06/04/23 (!) 142/78  10/08/22 128/80  07/08/22 134/72   Wt Readings from Last 3 Encounters:  06/04/23 139 lb (63 kg)  10/08/22 137 lb (62.1 kg)  07/08/22 133 lb (60.3 kg)   There is no height or weight on file to calculate BMI.    Physical Exam         Assessment & Plan:    See Problem List for Assessment and Plan of chronic medical problems.

## 2023-11-11 ENCOUNTER — Ambulatory Visit (INDEPENDENT_AMBULATORY_CARE_PROVIDER_SITE_OTHER): Payer: PRIVATE HEALTH INSURANCE | Admitting: Internal Medicine

## 2023-11-11 VITALS — BP 130/80 | HR 78 | Temp 98.8°F | Ht 62.0 in | Wt 140.0 lb

## 2023-11-11 DIAGNOSIS — I10 Essential (primary) hypertension: Secondary | ICD-10-CM

## 2023-11-11 NOTE — Patient Instructions (Signed)
        Medications changes include :   None

## 2023-12-04 ENCOUNTER — Ambulatory Visit: Payer: Self-pay | Admitting: Internal Medicine

## 2023-12-11 ENCOUNTER — Encounter: Payer: Self-pay | Admitting: Internal Medicine

## 2023-12-11 ENCOUNTER — Ambulatory Visit: Payer: PRIVATE HEALTH INSURANCE | Admitting: Internal Medicine

## 2023-12-11 ENCOUNTER — Ambulatory Visit: Payer: Self-pay | Admitting: Internal Medicine

## 2023-12-11 VITALS — BP 132/84 | HR 85 | Temp 98.5°F | Resp 16 | Ht 62.0 in | Wt 140.4 lb

## 2023-12-11 DIAGNOSIS — E785 Hyperlipidemia, unspecified: Secondary | ICD-10-CM | POA: Diagnosis not present

## 2023-12-11 DIAGNOSIS — I10 Essential (primary) hypertension: Secondary | ICD-10-CM

## 2023-12-11 DIAGNOSIS — Z124 Encounter for screening for malignant neoplasm of cervix: Secondary | ICD-10-CM | POA: Insufficient documentation

## 2023-12-11 LAB — CBC WITH DIFFERENTIAL/PLATELET
Basophils Absolute: 0 K/uL (ref 0.0–0.1)
Basophils Relative: 0.7 % (ref 0.0–3.0)
Eosinophils Absolute: 0.1 K/uL (ref 0.0–0.7)
Eosinophils Relative: 1 % (ref 0.0–5.0)
HCT: 38.2 % (ref 36.0–46.0)
Hemoglobin: 12.5 g/dL (ref 12.0–15.0)
Lymphocytes Relative: 30.9 % (ref 12.0–46.0)
Lymphs Abs: 1.6 K/uL (ref 0.7–4.0)
MCHC: 32.7 g/dL (ref 30.0–36.0)
MCV: 86.4 fl (ref 78.0–100.0)
Monocytes Absolute: 0.5 K/uL (ref 0.1–1.0)
Monocytes Relative: 9 % (ref 3.0–12.0)
Neutro Abs: 3 K/uL (ref 1.4–7.7)
Neutrophils Relative %: 58.4 % (ref 43.0–77.0)
Platelets: 223 K/uL (ref 150.0–400.0)
RBC: 4.42 Mil/uL (ref 3.87–5.11)
RDW: 14.9 % (ref 11.5–15.5)
WBC: 5.1 K/uL (ref 4.0–10.5)

## 2023-12-11 LAB — HEPATIC FUNCTION PANEL
ALT: 14 U/L (ref 0–35)
AST: 17 U/L (ref 0–37)
Albumin: 4.5 g/dL (ref 3.5–5.2)
Alkaline Phosphatase: 84 U/L (ref 39–117)
Bilirubin, Direct: 0 mg/dL (ref 0.0–0.3)
Total Bilirubin: 0.3 mg/dL (ref 0.2–1.2)
Total Protein: 7.5 g/dL (ref 6.0–8.3)

## 2023-12-11 LAB — BASIC METABOLIC PANEL WITH GFR
BUN: 13 mg/dL (ref 6–23)
CO2: 30 meq/L (ref 19–32)
Calcium: 9.7 mg/dL (ref 8.4–10.5)
Chloride: 103 meq/L (ref 96–112)
Creatinine, Ser: 0.84 mg/dL (ref 0.40–1.20)
GFR: 77.52 mL/min (ref >60.00)
Glucose, Bld: 95 mg/dL (ref 70–99)
Potassium: 4.3 meq/L (ref 3.5–5.1)
Sodium: 140 meq/L (ref 135–145)

## 2023-12-11 LAB — LIPID PANEL
Cholesterol: 195 mg/dL (ref 0–200)
HDL: 55.6 mg/dL (ref >39.00)
LDL Cholesterol: 120 mg/dL — ABNORMAL HIGH (ref 0–99)
NonHDL: 139.77
Total CHOL/HDL Ratio: 4
Triglycerides: 101 mg/dL (ref 0.0–149.0)
VLDL: 20.2 mg/dL (ref 0.0–40.0)

## 2023-12-11 MED ORDER — AMLODIPINE BESYLATE 5 MG PO TABS: 5.0000 mg | ORAL_TABLET | Freq: Every day | ORAL | 1 refills | Status: AC

## 2023-12-11 NOTE — Progress Notes (Signed)
 Subjective:  Patient ID: Tracy Walton, female    DOB: 1965-06-17  Age: 58 y.o. MRN: [id number]  CC: Hypertension   HPI [name] presents for f/up ----  Discussed the use of AI scribe software for clinical note transcription with the patient, who gave verbal consent to proceed.  History of Present Illness Tracy Walton is a 58 year old female with hypertension who presents for a routine follow-up.  She feels well today and denies any symptoms of illness. She is wearing a mask for protection, not because she feels sick. She has not received a flu shot yet and does not plan to get one today. She has received a COVID vaccine previously but does not wish to get the latest one that came out a couple of months ago.  No symptoms of high blood pressure such as headache or blurred vision. Her blood pressure was noted to be 150 earlier today, but she wonders if this is due to not exercising. She feels great when she exercises and denies any chest pain, shortness of breath, dizziness, or lightheadedness.  No side effects from her medication, specifically no swelling in her legs or feet. She is interested in dietary advice to help manage her blood pressure and has started eating more fish and beans. She acknowledges consuming canned beans, which contain sodium.  Her bowel movements are regular without constipation or diarrhea.     Outpatient Medications Prior to Visit  Medication Sig Dispense Refill   Cholecalciferol  50 MCG (2000 UT) TABS Take 2 tablets (4,000 Units total) by mouth daily. 180 tablet 1   Iron , Ferrous Sulfate , 325 (65 Fe) MG TABS Take 325 mg by mouth daily.     potassium chloride  (KLOR-CON ) 10 MEQ tablet Take 1 tablet (10 mEq total) by mouth 2 (two) times daily. 180 tablet 0   zinc  gluconate 50 MG tablet Take 1 tablet (50 mg total) by mouth daily. 90 tablet 1   amLODipine  (NORVASC ) 5 MG tablet Take 1 tablet (5 mg total) by mouth daily. 90 tablet 1   No  facility-administered medications prior to visit.    ROS Review of Systems  Constitutional:  Negative for appetite change, chills, diaphoresis, fatigue and fever.  HENT: Negative.    Eyes: Negative.   Respiratory: Negative.  Negative for cough, chest tightness, shortness of breath and wheezing.   Cardiovascular:  Negative for chest pain, palpitations and leg swelling.  Gastrointestinal: Negative.  Negative for abdominal pain, constipation, diarrhea, nausea and vomiting.  Endocrine: Negative.   Genitourinary: Negative.  Negative for difficulty urinating.  Musculoskeletal: Negative.  Negative for arthralgias, joint swelling and myalgias.  Skin: Negative.   Neurological:  Negative for dizziness, weakness and light-headedness.  Hematological:  Negative for adenopathy. Does not bruise/bleed easily.  Psychiatric/Behavioral: Negative.      Objective:  BP 132/84 (BP Location: Left Arm, Patient Position: Sitting, Cuff Size: Normal)   Pulse 85   Temp 98.5 F (36.9 C) (Oral)   Resp 16   Ht 5' 2 (1.575 m)   Wt 140 lb 6.4 oz (63.7 kg)   SpO2 97%   BMI 25.68 kg/m   BP Readings from Last 3 Encounters:  12/11/23 132/84  11/11/23 130/80  06/04/23 (!) 142/78    Wt Readings from Last 3 Encounters:  12/11/23 140 lb 6.4 oz (63.7 kg)  11/11/23 140 lb (63.5 kg)  06/04/23 139 lb (63 kg)    Physical Exam Vitals reviewed.  Constitutional:      Appearance: Normal  appearance.  HENT:     Nose: Nose normal.     Mouth/Throat:     Mouth: Mucous membranes are moist.  Eyes:     General: No scleral icterus.    Conjunctiva/sclera: Conjunctivae normal.  Cardiovascular:     Rate and Rhythm: Normal rate and regular rhythm.     Heart sounds: No murmur heard.    No friction rub. No gallop.  Pulmonary:     Effort: Pulmonary effort is normal.     Breath sounds: No stridor. No wheezing, rhonchi or rales.  Abdominal:     General: Abdomen is flat.     Palpations: There is no mass.     Tenderness:  There is no abdominal tenderness. There is no guarding.     Hernia: No hernia is present.  Musculoskeletal:        General: Normal range of motion.     Cervical back: Normal range of motion and neck supple.     Right lower leg: No edema.     Left lower leg: No edema.  Lymphadenopathy:     Cervical: No cervical adenopathy.  Skin:    General: Skin is warm and dry.  Neurological:     General: No focal deficit present.     Mental Status: She is alert.  Psychiatric:        Mood and Affect: Mood normal.        Behavior: Behavior normal.     Lab Results  Component Value Date   WBC 5.1 12/11/2023   HGB 12.5 12/11/2023   HCT 38.2 12/11/2023   PLT 223.0 12/11/2023   GLUCOSE 95 12/11/2023   CHOL 195 12/11/2023   TRIG 101.0 12/11/2023   HDL 55.60 12/11/2023   LDLCALC 120 (H) 12/11/2023   ALT 14 12/11/2023   AST 17 12/11/2023   NA 140 12/11/2023   K 4.3 12/11/2023   CL 103 12/11/2023   CREATININE 0.84 12/11/2023   BUN 13 12/11/2023   CO2 30 12/11/2023   TSH 1.28 06/04/2023    No results found.  Assessment & Plan:  Cervical cancer screening -     Ambulatory referral to Gynecology  Primary hypertension- BP is adequately well controlled. -     Basic metabolic panel with GFR; Future -     CBC with Differential/Platelet; Future -     Hepatic function panel; Future -     amLODIPine  Besylate; Take 1 tablet (5 mg total) by mouth daily.  Dispense: 90 tablet; Refill: 1  Dyslipidemia, goal LDL below 130- Statin is not indicated. -     Lipid panel; Future -     Hepatic function panel; Future     Follow-up: Return in about 6 months (around 06/10/2024).  Debby Molt, MD

## 2023-12-11 NOTE — Patient Instructions (Signed)
 Hypertension, Adult High blood pressure (hypertension) is when the force of blood pumping through the arteries is too strong. The arteries are the blood vessels that carry blood from the heart throughout the body. Hypertension forces the heart to work harder to pump blood and may cause arteries to become narrow or stiff. Untreated or uncontrolled hypertension can lead to a heart attack, heart failure, a stroke, kidney disease, and other problems. A blood pressure reading consists of a higher number over a lower number. Ideally, your blood pressure should be below 120/80. The first ("top") number is called the systolic pressure. It is a measure of the pressure in your arteries as your heart beats. The second ("bottom") number is called the diastolic pressure. It is a measure of the pressure in your arteries as the heart relaxes. What are the causes? The exact cause of this condition is not known. There are some conditions that result in high blood pressure. What increases the risk? Certain factors may make you more likely to develop high blood pressure. Some of these risk factors are under your control, including: Smoking. Not getting enough exercise or physical activity. Being overweight. Having too much fat, sugar, calories, or salt (sodium) in your diet. Drinking too much alcohol. Other risk factors include: Having a personal history of heart disease, diabetes, high cholesterol, or kidney disease. Stress. Having a family history of high blood pressure and high cholesterol. Having obstructive sleep apnea. Age. The risk increases with age. What are the signs or symptoms? High blood pressure may not cause symptoms. Very high blood pressure (hypertensive crisis) may cause: Headache. Fast or irregular heartbeats (palpitations). Shortness of breath. Nosebleed. Nausea and vomiting. Vision changes. Severe chest pain, dizziness, and seizures. How is this diagnosed? This condition is diagnosed by  measuring your blood pressure while you are seated, with your arm resting on a flat surface, your legs uncrossed, and your feet flat on the floor. The cuff of the blood pressure monitor will be placed directly against the skin of your upper arm at the level of your heart. Blood pressure should be measured at least twice using the same arm. Certain conditions can cause a difference in blood pressure between your right and left arms. If you have a high blood pressure reading during one visit or you have normal blood pressure with other risk factors, you may be asked to: Return on a different day to have your blood pressure checked again. Monitor your blood pressure at home for 1 week or longer. If you are diagnosed with hypertension, you may have other blood or imaging tests to help your health care provider understand your overall risk for other conditions. How is this treated? This condition is treated by making healthy lifestyle changes, such as eating healthy foods, exercising more, and reducing your alcohol intake. You may be referred for counseling on a healthy diet and physical activity. Your health care provider may prescribe medicine if lifestyle changes are not enough to get your blood pressure under control and if: Your systolic blood pressure is above 130. Your diastolic blood pressure is above 80. Your personal target blood pressure may vary depending on your medical conditions, your age, and other factors. Follow these instructions at home: Eating and drinking  Eat a diet that is high in fiber and potassium, and low in sodium, added sugar, and fat. An example of this eating plan is called the DASH diet. DASH stands for Dietary Approaches to Stop Hypertension. To eat this way: Eat  plenty of fresh fruits and vegetables. Try to fill one half of your plate at each meal with fruits and vegetables. Eat whole grains, such as whole-wheat pasta, brown rice, or whole-grain bread. Fill about one  fourth of your plate with whole grains. Eat or drink low-fat dairy products, such as skim milk or low-fat yogurt. Avoid fatty cuts of meat, processed or cured meats, and poultry with skin. Fill about one fourth of your plate with lean proteins, such as fish, chicken without skin, beans, eggs, or tofu. Avoid pre-made and processed foods. These tend to be higher in sodium, added sugar, and fat. Reduce your daily sodium intake. Many people with hypertension should eat less than 1,500 mg of sodium a day. Do not drink alcohol if: Your health care provider tells you not to drink. You are pregnant, may be pregnant, or are planning to become pregnant. If you drink alcohol: Limit how much you have to: 0-1 drink a day for women. 0-2 drinks a day for men. Know how much alcohol is in your drink. In the [place] one drink equals one 12 oz bottle of beer (355 mL), one 5 oz glass of wine (148 mL), or one 1 oz glass of hard liquor (44 mL). Lifestyle  Work with your health care provider to maintain a healthy body weight or to lose weight. Ask what an ideal weight is for you. Get at least 30 minutes of exercise that causes your heart to beat faster (aerobic exercise) most days of the week. Activities may include walking, swimming, or biking. Include exercise to strengthen your muscles (resistance exercise), such as Pilates or lifting weights, as part of your weekly exercise routine. Try to do these types of exercises for 30 minutes at least 3 days a week. Do not use any products that contain nicotine or tobacco. These products include cigarettes, chewing tobacco, and vaping devices, such as e-cigarettes. If you need help quitting, ask your health care provider. Monitor your blood pressure at home as told by your health care provider. Keep all follow-up visits. This is important. Medicines Take over-the-counter and prescription medicines only as told by your health care provider. Follow directions carefully. Blood  pressure medicines must be taken as prescribed. Do not skip doses of blood pressure medicine. Doing this puts you at risk for problems and can make the medicine less effective. Ask your health care provider about side effects or reactions to medicines that you should watch for. Contact a health care provider if you: Think you are having a reaction to a medicine you are taking. Have headaches that keep coming back (recurring). Feel dizzy. Have swelling in your ankles. Have trouble with your vision. Get help right away if you: Develop a severe headache or confusion. Have unusual weakness or numbness. Feel faint. Have severe pain in your chest or abdomen. Vomit repeatedly. Have trouble breathing. These symptoms may be an emergency. Get help right away. Call 911. Do not wait to see if the symptoms will go away. Do not drive yourself to the hospital. Summary Hypertension is when the force of blood pumping through your arteries is too strong. If this condition is not controlled, it may put you at risk for serious complications. Your personal target blood pressure may vary depending on your medical conditions, your age, and other factors. For most people, a normal blood pressure is less than 120/80. Hypertension is treated with lifestyle changes, medicines, or a combination of both. Lifestyle changes include losing weight, eating a healthy,  low-sodium diet, exercising more, and limiting alcohol. This information is not intended to replace advice given to you by your health care provider. Make sure you discuss any questions you have with your health care provider. Document Revised: 12/26/2020 Document Reviewed: 12/26/2020 Elsevier Patient Education  2024 [employer].

## 2024-06-10 ENCOUNTER — Ambulatory Visit: Payer: PRIVATE HEALTH INSURANCE | Admitting: Internal Medicine
